# Patient Record
Sex: Female | Born: 1965 | Race: Black or African American | Hispanic: No | Smoking: Never smoker
Health system: Southern US, Community
[De-identification: ages and names within clinical notes are randomized; demographics above are authoritative.]

## PROBLEM LIST (undated history)

## (undated) DIAGNOSIS — K439 Ventral hernia without obstruction or gangrene: Secondary | ICD-10-CM

## (undated) DIAGNOSIS — N83209 Unspecified ovarian cyst, unspecified side: Secondary | ICD-10-CM

## (undated) DIAGNOSIS — S76309A Unspecified injury of muscle, fascia and tendon of the posterior muscle group at thigh level, unspecified thigh, initial encounter: Secondary | ICD-10-CM

## (undated) DIAGNOSIS — N926 Irregular menstruation, unspecified: Secondary | ICD-10-CM

## (undated) DIAGNOSIS — O039 Complete or unspecified spontaneous abortion without complication: Secondary | ICD-10-CM

## (undated) DIAGNOSIS — D219 Benign neoplasm of connective and other soft tissue, unspecified: Secondary | ICD-10-CM

## (undated) DIAGNOSIS — R7611 Nonspecific reaction to tuberculin skin test without active tuberculosis: Secondary | ICD-10-CM

## (undated) DIAGNOSIS — S76319A Strain of muscle, fascia and tendon of the posterior muscle group at thigh level, unspecified thigh, initial encounter: Secondary | ICD-10-CM

## (undated) DIAGNOSIS — K922 Gastrointestinal hemorrhage, unspecified: Secondary | ICD-10-CM

## (undated) HISTORY — DX: Strain of muscle, fascia and tendon of the posterior muscle group at thigh level, unspecified thigh, initial encounter: S76.319A

## (undated) HISTORY — DX: Nonspecific reaction to tuberculin skin test without active tuberculosis: R76.11

## (undated) HISTORY — DX: Ventral hernia without obstruction or gangrene: K43.9

## (undated) HISTORY — DX: Irregular menstruation, unspecified: N92.6

## (undated) HISTORY — DX: Complete or unspecified spontaneous abortion without complication: O03.9

## (undated) HISTORY — DX: Benign neoplasm of connective and other soft tissue, unspecified: D21.9

## (undated) HISTORY — DX: Unspecified ovarian cyst, unspecified side: N83.209

## (undated) HISTORY — DX: Gastrointestinal hemorrhage, unspecified: K92.2

## (undated) HISTORY — PX: OTHER SURGICAL HISTORY: SHX169

## (undated) HISTORY — DX: Unspecified injury of muscle, fascia and tendon of the posterior muscle group at thigh level, unspecified thigh, initial encounter: S76.309A

---

## 1990-12-29 DIAGNOSIS — R7611 Nonspecific reaction to tuberculin skin test without active tuberculosis: Secondary | ICD-10-CM

## 1990-12-29 HISTORY — DX: Nonspecific reaction to tuberculin skin test without active tuberculosis: R76.11

## 2009-12-11 LAB — CBC AND DIFFERENTIAL: WBC: 4.3

## 2009-12-11 LAB — TSH: TSH: 1.08

## 2009-12-11 LAB — FOLLICLE STIMULATING HORMONE: FSH: 63.9

## 2009-12-29 HISTORY — PX: COLPOSCOPY: SHX161

## 2010-10-29 HISTORY — PX: ENDOMETRIAL BIOPSY: SHX622

## 2010-11-11 LAB — CBC AND DIFFERENTIAL
HCT: 36 %
Hemoglobin: 12.2 g/dL (ref 12.0–16.0)
WBC: 3.5

## 2011-04-14 ENCOUNTER — Ambulatory Visit (INDEPENDENT_AMBULATORY_CARE_PROVIDER_SITE_OTHER): Payer: No Typology Code available for payment source | Admitting: Family Medicine

## 2011-04-14 ENCOUNTER — Ambulatory Visit (INDEPENDENT_AMBULATORY_CARE_PROVIDER_SITE_OTHER)
Admission: RE | Admit: 2011-04-14 | Discharge: 2011-04-14 | Disposition: A | Discharge status: Home / Self Care | Payer: No Typology Code available for payment source | Source: Ambulatory Visit | Attending: Family Medicine | Admitting: Family Medicine

## 2011-04-14 ENCOUNTER — Encounter: Payer: Self-pay | Admitting: Family Medicine

## 2011-04-14 VITALS — BP 110/76 | HR 84 | Temp 98.3°F | Ht 62.0 in | Wt 116.0 lb

## 2011-04-14 DIAGNOSIS — S93401A Sprain of unspecified ligament of right ankle, initial encounter: Secondary | ICD-10-CM

## 2011-04-14 DIAGNOSIS — M25579 Pain in unspecified ankle and joints of unspecified foot: Secondary | ICD-10-CM

## 2011-04-14 DIAGNOSIS — N643 Galactorrhea not associated with childbirth: Secondary | ICD-10-CM | POA: Insufficient documentation

## 2011-04-14 DIAGNOSIS — E039 Hypothyroidism, unspecified: Secondary | ICD-10-CM

## 2011-04-14 DIAGNOSIS — M25571 Pain in right ankle and joints of right foot: Secondary | ICD-10-CM | POA: Insufficient documentation

## 2011-04-14 DIAGNOSIS — Z0000 Encounter for general adult medical examination without abnormal findings: Secondary | ICD-10-CM | POA: Insufficient documentation

## 2011-04-14 DIAGNOSIS — S93409A Sprain of unspecified ligament of unspecified ankle, initial encounter: Secondary | ICD-10-CM

## 2011-04-14 LAB — CBC WITH DIFFERENTIAL/PLATELET
Basophils Relative: 0.7 % (ref 0.0–3.0)
Eosinophils Relative: 3 % (ref 0.0–5.0)
HCT: 40.2 % (ref 36.0–46.0)
Lymphs Abs: 1.4 10*3/uL (ref 0.7–4.0)
MCV: 95.8 fl (ref 78.0–100.0)
Monocytes Absolute: 0.3 10*3/uL (ref 0.1–1.0)
Neutrophils Relative %: 41.2 % — ABNORMAL LOW (ref 43.0–77.0)
RBC: 4.2 Mil/uL (ref 3.87–5.11)
WBC: 3.1 10*3/uL — ABNORMAL LOW (ref 4.5–10.5)

## 2011-04-14 LAB — LIPID PANEL
Cholesterol: 188 mg/dL (ref 0–200)
LDL Cholesterol: 112 mg/dL — ABNORMAL HIGH (ref 0–99)
Triglycerides: 52 mg/dL (ref 0.0–149.0)
VLDL: 10.4 mg/dL (ref 0.0–40.0)

## 2011-04-14 LAB — COMPREHENSIVE METABOLIC PANEL
AST: 19 U/L (ref 0–37)
Alkaline Phosphatase: 27 U/L — ABNORMAL LOW (ref 39–117)
BUN: 12 mg/dL (ref 6–23)
Creatinine, Ser: 0.8 mg/dL (ref 0.4–1.2)
Total Bilirubin: 0.4 mg/dL (ref 0.3–1.2)

## 2011-04-14 NOTE — Assessment & Plan Note (Signed)
Consistent with lateral ankle sprain.  Given continued pain, checked xray - negative for fracture. Continue ACE wrap and provided with ankle sprain handout from [facility] patient advisor, provided with stretching exercises and strengthening exercises (ie alphabet in air).

## 2011-04-14 NOTE — Assessment & Plan Note (Signed)
New patient, routine blood work today. Discussed backing down on caffeine, healthy eating. F/u as needed or in 1 year. Provided with order for mammogram, states will obtain at [facility] (where previous records are). No previous PCP so no records to review (besides OBGYN).

## 2011-04-14 NOTE — Assessment & Plan Note (Signed)
Chronic per patient, states has had full w/u including CT, mammo, [place] by OBGYN. Stable, monitor for now.

## 2011-04-14 NOTE — Patient Instructions (Signed)
Xray of ankle today. Blood work today to check cholesterol levels, sugar, thyroid and CBC. Return as needed or in 1 year for next physical. If ankle not improving as expected, return sooner. Good to meet you today, call us with questions.
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
## 2011-04-15 ENCOUNTER — Encounter: Payer: Self-pay | Admitting: Family Medicine

## 2011-04-15 LAB — T3, FREE: T3, Free: 3 pg/mL (ref 2.3–4.2)

## 2011-04-16 ENCOUNTER — Telehealth: Payer: Self-pay | Admitting: Family Medicine

## 2011-04-16 NOTE — Telephone Encounter (Signed)
Patient notified and will call back to schedule labs. She doesn't have er schedule that far in advance. Copy of results mailed to patient as requested.

## 2011-04-16 NOTE — Telephone Encounter (Signed)
Please notify LDL cholesterol 112, other levels fine. CMP overall normal. White count a bit low, with ANC 1.3, would like her to return for recheck in 3 months (CBC, neutropenia).  I'm also requesting records from OBGYN to compare values. TSH a bit low at 0.33 (normal range 0.35-5.5) but free T3 and T4 normal.  Monitor for now, likely recheck next year. Offer to send copy of blood work.

## 2011-05-22 ENCOUNTER — Other Ambulatory Visit: Payer: No Typology Code available for payment source

## 2011-05-27 ENCOUNTER — Encounter: Payer: Self-pay | Admitting: *Deleted

## 2011-05-27 ENCOUNTER — Other Ambulatory Visit: Payer: Self-pay | Admitting: Family Medicine

## 2011-05-27 DIAGNOSIS — Z13220 Encounter for screening for lipoid disorders: Secondary | ICD-10-CM

## 2011-05-28 ENCOUNTER — Other Ambulatory Visit: Payer: No Typology Code available for payment source

## 2011-05-30 ENCOUNTER — Encounter: Payer: Self-pay | Admitting: Family Medicine

## 2011-06-01 ENCOUNTER — Encounter: Payer: Self-pay | Admitting: Family Medicine

## 2011-06-02 ENCOUNTER — Encounter: Payer: Self-pay | Admitting: Family Medicine

## 2012-01-30 DIAGNOSIS — S76309A Unspecified injury of muscle, fascia and tendon of the posterior muscle group at thigh level, unspecified thigh, initial encounter: Secondary | ICD-10-CM

## 2012-01-30 HISTORY — DX: Unspecified injury of muscle, fascia and tendon of the posterior muscle group at thigh level, unspecified thigh, initial encounter: S76.309A

## 2012-02-20 ENCOUNTER — Ambulatory Visit (INDEPENDENT_AMBULATORY_CARE_PROVIDER_SITE_OTHER): Payer: BC Managed Care – PPO | Admitting: Family Medicine

## 2012-02-20 ENCOUNTER — Ambulatory Visit: Payer: No Typology Code available for payment source | Admitting: Family Medicine

## 2012-02-20 ENCOUNTER — Encounter: Payer: Self-pay | Admitting: Family Medicine

## 2012-02-20 VITALS — BP 120/84 | HR 84 | Temp 97.6°F | Ht 62.0 in | Wt 128.0 lb

## 2012-02-20 DIAGNOSIS — S79919A Unspecified injury of unspecified hip, initial encounter: Secondary | ICD-10-CM

## 2012-02-20 DIAGNOSIS — S76309A Unspecified injury of muscle, fascia and tendon of the posterior muscle group at thigh level, unspecified thigh, initial encounter: Secondary | ICD-10-CM | POA: Insufficient documentation

## 2012-02-20 DIAGNOSIS — K439 Ventral hernia without obstruction or gangrene: Secondary | ICD-10-CM | POA: Insufficient documentation

## 2012-02-20 MED ORDER — NAPROXEN 500 MG PO TABS: ORAL_TABLET | ORAL | Status: DC

## 2012-02-20 NOTE — Progress Notes (Addendum)
Subjective:    Patient ID: Lori Parks, female    DOB: 1966/11/20, 46 y.o.   MRN: [id number]  HPI CC: thigh injury  Dr. Cathey Endow (EDP at [facility]) presents today with hamstring injury.  3 wks ago started racing with children - sprinting really fast.  Did not stretch.  Barefoot on asphalt.  Slipped and then felt huge pop posterior hamstring on left.  Then felt like muscles torn on posterior of thigh.  Ecchymosis 3 d after injury that has slowly resolved.  Now 2d h/o numbness at left buttock and 1d h/o burning pain anterior thigh superior to knee as well as burning pain medial and lateral thigh.  Initially felt "muscle hanging out of thigh"  Has been using crutches, motrin, tylenol, wrapping with ace bandages.  No h/o thigh injury in past.  Was runner in past.  No back pain.    If needs ortho referral would like [place] [facility] as closer to home/work.  Also would like to let me know of abd hernia present.  Medications and allergies reviewed and updated in chart.  Past histories reviewed and updated if relevant as below. Patient Active Problem List  Diagnoses  . Routine general medical examination at a health care facility  . Right ankle pain  . Galactorrhea   Past Medical History  Diagnosis Date  . Irregular menses   . Galactorrhea     s/p full w/u including CAT by OBGYN, since ag 16yo  . Ovarian cyst   . Fibroids     mult small myomata (Dr. Dairl Ponder, OBGYN)  . Miscarriage 1990s  . PPD positive, treated 1992    s/p INH treatment   Past Surgical History  Procedure Date  . Colposcopy 2011    WNL, abnl pap CIN1 11/2009, LGSIL neg HPV 10/2010, rec rpt 6 mo  . Endometrial biopsy 10/2010    benign proliferative biopsy, not menopausal   History  Substance Use Topics  . Smoking status: Never Smoker   . Smokeless tobacco: Never Used  . Alcohol Use: No   Family History  Problem Relation Age of Onset  . Arthritis Mother   . Hypertension Mother   . Stroke Maternal Grandmother   .  Hypertension Maternal Grandmother   . Coronary artery disease Neg Hx   . Cancer Neg Hx    Allergies  Allergen Reactions  . Codeine Nausea And Vomiting and Rash   Current Outpatient Prescriptions on File Prior to Visit  Medication Sig Dispense Refill  . medroxyPROGESTERone (PROVERA) 10 MG tablet Take 10 mg by mouth as directed.         Review of Systems per HPI    Objective:   Physical Exam  Nursing note and vitals reviewed. Constitutional: She appears well-developed and well-nourished. No distress.       Walks with crutches, antalgic gait and limp present When sits on exam table favors right buttock  Abdominal:         Hernia present, reducible  Musculoskeletal:       Right upper leg: Normal.       Left upper leg: She exhibits tenderness and swelling. She exhibits no bony tenderness.       Legs:      Ecchymosis left posterior thigh as well as extending into left popliteal area. Very tender at distal and mid hamstring, some tenderness at insertion of hamstring. Prone - unable to assess hamstring strength 2/2 pain/cramping. Supine - FROM of hip flexors and knee flexion.  No pain with  internal/external rotation of   Neurological:       + left buttock numbness present  Skin: Skin is warm and dry.  Psychiatric: She has a normal mood and affect.       Assessment & Plan:

## 2012-02-20 NOTE — Assessment & Plan Note (Signed)
Anticipate diastasis recti vs mild epigastric hernia.  Monitor for now.  Pt states may desire surg eval in future.

## 2012-02-20 NOTE — Assessment & Plan Note (Signed)
Given mechanism of injury, description of initial hematoma, and exam today, concern for hamstring tendon rupture. Will order MRI of left lower extremity.  Pt can get done at [facility]. Depending on results will refer to ortho. May very well have post-injury sciatica, treat with naprosyn. Pt agrees with plan.

## 2012-02-20 NOTE — Patient Instructions (Addendum)
We will send you out with order for MRI of left lower extremity. Sounds like you have developed post-injury sciatica. Treat with naprosyn twice daily with food for a week then as needed. I also will likely refer you to ortho for further eval depending on MRI results. Good to see you today, call us with questions.

## 2012-02-24 NOTE — Progress Notes (Signed)
Addended by: Eustaquio Boyden on: 02/24/2012 10:46 AM   Modules accepted: Orders

## 2012-02-28 ENCOUNTER — Ambulatory Visit: Payer: Self-pay | Admitting: Family Medicine

## 2012-03-01 ENCOUNTER — Encounter: Payer: Self-pay | Admitting: Family Medicine

## 2012-03-01 ENCOUNTER — Telehealth: Payer: Self-pay | Admitting: Family Medicine

## 2012-03-01 DIAGNOSIS — S76309A Unspecified injury of muscle, fascia and tendon of the posterior muscle group at thigh level, unspecified thigh, initial encounter: Secondary | ICD-10-CM

## 2012-03-01 NOTE — Telephone Encounter (Addendum)
Called to discuss results - proximal hamstring avulsion/tear with possible hematoma formation. Left message on answering machine. Will refer to [place] [facility] per pt preference.  placed order in chart, will route to [facility].

## 2012-03-02 ENCOUNTER — Telehealth: Payer: Self-pay | Admitting: Family Medicine

## 2012-03-02 NOTE — Telephone Encounter (Signed)
Called Dr Cathey Endow to help make Ortho consult and she said she already made her own appt with Dr Penni Bombard with [facility] and he has her going to PT , she also said she had the MRI report faxed to Dr Penni Bombard as well.

## 2012-03-02 NOTE — Telephone Encounter (Signed)
Pt states has already scheduled ortho appt.  See next phone note by Shirlee Limerick.  Cancelled order.

## 2012-03-02 NOTE — Telephone Encounter (Signed)
Noted. Thanks.

## 2012-03-09 ENCOUNTER — Telehealth: Payer: Self-pay | Admitting: Family Medicine

## 2012-03-09 NOTE — Telephone Encounter (Signed)
Noted. Thanks.

## 2012-03-09 NOTE — Telephone Encounter (Signed)
Patient called to let you know she's having surgery done on her hamstring tomorrow by Dr.Kaplan.

## 2012-03-18 NOTE — Telephone Encounter (Signed)
Pt called office today to speak with me. Saw Dr. Arlyce Dice - orthopedist in [place] - had hamstring re attachment surgery last week - then post op slipped and had another hamstring tear. Rpt surgery for avulsion a few days later.  Released from Dr. Marzetta Board care today with brace, flying home today. Starting to have burning/tingling in thigh again, worried about rpt avulsion but denies any injury. Kim, can we call her to verify appt for tomorrow - and can we place her on schedule for tomorrow afternoon at 2:15, 30 min appt for f/u hamstring injury?

## 2012-03-18 NOTE — Telephone Encounter (Signed)
Appt scheduled and comfirmed with patient.

## 2012-03-19 ENCOUNTER — Ambulatory Visit: Payer: BC Managed Care – PPO | Admitting: Family Medicine

## 2012-03-19 ENCOUNTER — Telehealth: Payer: Self-pay | Admitting: Family Medicine

## 2012-03-19 MED ORDER — GABAPENTIN 300 MG PO CAPS: ORAL_CAPSULE | ORAL | Status: DC

## 2012-03-19 NOTE — Telephone Encounter (Signed)
Spoke with Dr. Cathey Endow as she was unable to make appt this afternoon.   Flew back yesterday from [place], planning on returning late next week for recheck with surgeon there (Dr. Arlyce Dice). On lovenox after surgery. Endorsing pain, swelling after surgery.  Endorsing some neuropathic pain as well.  Will do trial gabapentin for this. 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 Did touch base with Dr. Salena Saner re pt. Will see if we can schedule appt with Dr. Darrick Penna for ultrasound next week. Advised if worsening over weekend to be evaluated urgently.

## 2012-03-22 ENCOUNTER — Telehealth: Payer: Self-pay | Admitting: Family Medicine

## 2012-03-22 ENCOUNTER — Ambulatory Visit: Payer: BC Managed Care – PPO | Admitting: Family Medicine

## 2012-03-22 DIAGNOSIS — S76309A Unspecified injury of muscle, fascia and tendon of the posterior muscle group at thigh level, unspecified thigh, initial encounter: Secondary | ICD-10-CM

## 2012-03-22 NOTE — Telephone Encounter (Signed)
Did speak with pt this morning - pt scheduled for [facility] appt tomorrow afternoon.

## 2012-03-22 NOTE — Telephone Encounter (Signed)
Touched base with Dr. Darrick Penna and Dr Patsy Lager.  Will set pt up tomorrow for eval at [facility]. Will route note to [facility] asking to set pt up for appt. Tried to reach patient - inbox full and unable to leave message.

## 2012-03-23 ENCOUNTER — Ambulatory Visit (INDEPENDENT_AMBULATORY_CARE_PROVIDER_SITE_OTHER): Payer: BC Managed Care – PPO | Admitting: Family Medicine

## 2012-03-23 VITALS — BP 108/68 | Ht 62.0 in | Wt 122.0 lb

## 2012-03-23 DIAGNOSIS — IMO0002 Reserved for concepts with insufficient information to code with codable children: Secondary | ICD-10-CM

## 2012-03-23 DIAGNOSIS — S76319A Strain of muscle, fascia and tendon of the posterior muscle group at thigh level, unspecified thigh, initial encounter: Secondary | ICD-10-CM

## 2012-03-24 ENCOUNTER — Encounter: Payer: Self-pay | Admitting: Family Medicine

## 2012-03-28 ENCOUNTER — Encounter: Payer: Self-pay | Admitting: Family Medicine

## 2012-03-28 DIAGNOSIS — S76319A Strain of muscle, fascia and tendon of the posterior muscle group at thigh level, unspecified thigh, initial encounter: Secondary | ICD-10-CM | POA: Insufficient documentation

## 2012-03-28 HISTORY — DX: Strain of muscle, fascia and tendon of the posterior muscle group at thigh level, unspecified thigh, initial encounter: S76.319A

## 2012-03-28 NOTE — Progress Notes (Signed)
[facility]:  Patient Name: Lori Parks Date of Birth: 1966/07/10 Medical Record Number: [id number] Gender: female Date of Encounter: 03/23/2012  Dear Dr. Sharen Hones,  Thank you for having me see Lori Parks in consultation today at the [facility] for her problem with Left posterior hamstring pain.  As you may recall, she is a 46 y.o. year old female with a history of Left sided 3 tendinous hamstring complete rupture that originally occurred on February 09, 2012. She had a large hematoma and significant bruising throughout the posterior aspect of her leg. As you recall, you evaluated her on February 20, 2012,  And at that point in addition to the hamstring injury, she had developed some LEFT buttocks numbness as well as some radiculopathy. She was NWB on crutches at that point but had persisted working.  In the following week, the patient saw Dr. Rodolph Bong from [facility], and she had an MRI of her left femur on 02/28/2012. Prior to her MRI, she had started some physical therapy at [facility] in [place]. Her MRI report is reviewed, which is significant for a full-thickness hamstring  insertional rupture. I do not have the original images for my review. Dr. Cathey Endow discussed and reviewed the images with 2 different radiologists, and per her report to me, she had a 6 cm retraction from the ischial tuberosity with a rupture of all 3 hamstring insertions. At this point she discussed the case [facility], and also personally consulted with other orthopedist that she knew from [facility], [place], [place] and other institutions. I agree with her summation that few people in the country had experience with doing a large volume of these repairs.  Ultimately the patient saw Dr. Arlyce Dice from the [facility] of orthopedics. On March 10, 2012, the patient had a LEFT open hand string repair. Operative  reports are reviewed. Her sciatic nerve was found  To be adjacent to the hamstrings with some inflammatory tissue surrounding, and the hamstring tendon was released from the sciatic nerve. The patient had been doing well up to that point.  In the postoperative period, the patient describes an event on March 13, 2012, where she reruptured her repair and hamstring tendon per her report attempting to get to the bathroom - and on record review by standing on a stool and falling and forcefully extending her leg. This was confirmed on a March 16, 2012 MRI of the LEFT hip with a complete full thickness tear of the semimembranosus, semitendinosus, and biceps femoris tendons with a 1.3 cm protraction. At that point, there was no evidence of sciatic nerve transection based on the MRI report. Original images are not available for review.  On March 17, 2012, the patient had a LEFT proximal hamstring avulsion revision, and neurolysis and separation of the HS from sciatic nerve. Per the discharge report from the [facility], the patient was to follow up with Dr. Arlyce Dice in a week, but she flew back to [facility] on 03/18/2012.   Now she is having some posterior thigh pain, question of whether or not she is reruptured her repair again. She is also having some radicular symptoms, and she is having some difficulty everting her foot on the LEFT side. Subjectively, she does have some occasional decreased sensation in her distal lower extremity. She remains nonweight bearing and in a brace provided by the [place] [facility] [place].  Past Medical History  Diagnosis Date  .  Irregular menses   . Galactorrhea     s/p full w/u including CAT by OBGYN, since ag 16yo  . Ovarian cyst   . Fibroids     mult small myomata (Dr. Dairl Ponder, OBGYN)  . Miscarriage 1990s  . 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 Colposcopy 2011    WNL, abnl pap CIN1 11/2009, LGSIL neg HPV 10/2010, rec rpt 6 mo  . Endometrial biopsy 10/2010    benign proliferative biopsy, not menopausal  . Left proximal hamstring avulsion repair w/ sciatic neurolysis x2 (02/2012)    with recurrent avulsion (Dr. Arlyce Dice)    History   Social History  . Marital Status: Divorced    Spouse Name: N/A    Number of Children: 3  . Years of Education: MD   Occupational History  . ER physician     [employer]   Social History Main Topics  . Smoking status: Never Smoker   . Smokeless tobacco: Never Used  . Alcohol Use: No  . Drug Use: No  . Sexually Active: Not on file   Other Topics Concern  . Not on file   Social History Narrative   Caffeine: 4-5 cups coffee.  1 can diet soda/dayLives with mother, 2 children, international student from [place], and cousin.  2 dogs.[employer] ER physician    Family History  Problem Relation Age of Onset  . Arthritis Mother   . Hypertension Mother   . Stroke Maternal Grandmother   . Hypertension Maternal Grandmother   . Coronary artery disease Neg Hx   . Cancer Neg Hx      Review of Systems:  GEN: No fevers, chills. Nontoxic. Primarily MSK c/o today. MSK: Detailed in the HPI GI: tolerating PO intake without difficulty Neuro: detailed above Some pain. Some radicular symptoms. No chest pain. No shortness of breath. Otherwise, the pertinent positives and negatives are listed above and in the HPI, otherwise a full review of systems has been reviewed and is negative unless noted positive.    Physical Examination: Filed Vitals:   03/23/12 1540  BP: 108/68  Height: 5\' 2"  (1.575 m)  Weight: 122 lb (55.339 kg)     General: Alert and oriented x4, in no acute distress. Well-developed well-nourished. Head and neck are grossly normal. Pulmonary: Breathing comparably and in no acute distress. Psychiatric: Mildly anxious, but full  affect, normally interactive and in no acute distress. Not depressed appearing. Neurological: The gross sensation in the LEFT lower extremity appears intact on examination. Eversion maybe slightly decreased compared to the RIGHT.  Bone and joint: The patient is wearing a large hinged brace. She is in a wheelchair. Moves easily with transfer. Posteriorly  On the LEFT the patient does have an incision in the gluteal cleft. Incision is clear dry and intact. There is no obvious defect palpable. Motor function is not assessed extensively due to her immediate postoperative period.  Diagnostic Ultrasound Evalution: [employer] E, MSK ultrasound, MSK probe Anatomy scanned: left hamstring and insertion to ischial tuberosity Indication: Pain Findings: Distal fibers of all 3 in string muscle bellies appear normal in echotexture. There is one area of this congruence appearing muscle fiber in the slightly proximal aspect of the medial hamstring which would appear to correspond to an area of repair. There  is no anechoic or hypoechoic appearance to this area. The hamstring tendon appears taught and contiguous to the area of attachment at the ischial tuberosity. There is evidence of some small degree of echotexture change at the ischial tuberosity which likely reflects  Postoperative change and reattachment to bone. There is a small area adjacent to the attachment which would correspond clinically to some small pseudobursitis. The suture line is visualized, with some change in echo texture which would correspond to her operative repair. There is one area of  Hypoechogenicity it would correspond with resolving hematoma and is not anechoic.  Overall, postoperative changes are visualized, but hamstring muscle belly, tendon, and insertion appear intact.  Assessment and Plan:  Impression: complex 3 tendinous insertional avulsion, operative open hamstring repair complicated by rerupture in the immediate  postoperative period and subsequent open hamstring repair and neurolysis of sciatic nerve.  By ultrasound, it appears that this patient has not reruptured her hamstring again.  Recommendations:  I spent greater than 60 minutes face-to-face with this patient, more than 50% in counseling. Literature reviewed regarding hamstring injury, hamstring proximal full-thickness ruptures, hamstring repair, hamstring syndrome. Dr. Darrick Penna and I also discussed the case, and he performed u/s, as well.  She continues to have pain and some neurological symptoms. For now, I suggested that she get over the immediate postoperative period, but we discussed that she may have persistent nerve dysfunction long-term.  Ideally, I strongly encouraged her to follow up with her orthopedic surgeon Dr. Arlyce Dice for his expertise and guidance.  We will see the patient back in 1 month to follow healing with U/S visualization.  Thank you for having [place] see Lori Kanouse in consultation.  Feel free to contact me with any questions.  Ilias Stcharles T. Zaniah Titterington, MD, CAQ Sports Medicine [facility] [address] [place], [place] [address] Phone: [phone] Fax: [phone]

## 2012-04-20 ENCOUNTER — Ambulatory Visit: Payer: BC Managed Care – PPO | Admitting: Family Medicine

## 2012-05-03 ENCOUNTER — Telehealth: Payer: Self-pay

## 2012-05-03 NOTE — Telephone Encounter (Signed)
Called and spoke with pt. Decreased strength on left side, but now noticing upper extremity weakness as well. Out of brace, started PT, now told ortho doctor wants to see. Seeing ortho Dr. Tonye Becket in [place] [facility]. Calling to get rec on neurologist.  Discussed [facility] vs Dr. Modesto Charon. Pt will call both to check on insurance etc.

## 2012-05-03 NOTE — Telephone Encounter (Signed)
Pt wants to speak with Dr Sharen Hones about hamstring injury. Pt would not go into more detail. 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 [phone].

## 2012-05-06 ENCOUNTER — Emergency Department (HOSPITAL_COMMUNITY)
Admission: EM | Admit: 2012-05-06 | Discharge: 2012-05-07 | Disposition: A | Discharge status: Home / Self Care | Payer: BC Managed Care – PPO | Attending: Emergency Medicine | Admitting: Emergency Medicine

## 2012-05-06 ENCOUNTER — Encounter (HOSPITAL_COMMUNITY): Payer: Self-pay | Admitting: Radiology

## 2012-05-06 ENCOUNTER — Emergency Department (HOSPITAL_COMMUNITY): Payer: BC Managed Care – PPO

## 2012-05-06 DIAGNOSIS — R209 Unspecified disturbances of skin sensation: Secondary | ICD-10-CM | POA: Insufficient documentation

## 2012-05-06 DIAGNOSIS — R29898 Other symptoms and signs involving the musculoskeletal system: Secondary | ICD-10-CM | POA: Insufficient documentation

## 2012-05-06 DIAGNOSIS — R471 Dysarthria and anarthria: Secondary | ICD-10-CM | POA: Insufficient documentation

## 2012-05-06 DIAGNOSIS — Z79899 Other long term (current) drug therapy: Secondary | ICD-10-CM | POA: Insufficient documentation

## 2012-05-06 DIAGNOSIS — R202 Paresthesia of skin: Secondary | ICD-10-CM

## 2012-05-06 LAB — POCT I-STAT, CHEM 8
BUN: 18 mg/dL (ref 6–23)
Chloride: 103 mEq/L (ref 96–112)
Creatinine, Ser: 0.9 mg/dL (ref 0.50–1.10)
Glucose, Bld: 93 mg/dL (ref 70–99)
HCT: 38 % (ref 36.0–46.0)
Potassium: 3.3 mEq/L — ABNORMAL LOW (ref 3.5–5.1)

## 2012-05-06 LAB — BASIC METABOLIC PANEL
Chloride: 102 mEq/L (ref 96–112)
GFR calc Af Amer: >90 mL/min (ref >90)
GFR calc non Af Amer: 85 mL/min — ABNORMAL LOW (ref >90)
Potassium: 3.3 mEq/L — ABNORMAL LOW (ref 3.5–5.1)
Sodium: 140 mEq/L (ref 135–145)

## 2012-05-06 LAB — CBC
Hemoglobin: 12.1 g/dL (ref 12.0–15.0)
MCHC: 33.8 g/dL (ref 30.0–36.0)
RDW: 12.8 % (ref 11.5–15.5)
WBC: 4.1 10*3/uL (ref 4.0–10.5)

## 2012-05-06 MED ORDER — SODIUM CHLORIDE 0.9 % IV SOLN: INTRAVENOUS | Status: DC

## 2012-05-06 NOTE — ED Notes (Addendum)
Patient describes the feeling as being pins and needles in her left extremities. CBG WNL. No facial droop noticed, pt is slurring some of her speech.

## 2012-05-06 NOTE — Code Documentation (Signed)
45yo bf brought in via pvt vehicle for stroke symptoms.  Pt with recent Lt thigh surgery for hamstring avulsion. Has been on crutches & developed Lt arm weakness x1wk ago. Was watching TV this evening and developed Lt UE and Lt facial numbness & tingling. NIH 6 with mild dysarthria, mild aphasia, mild sensory loss, and LLE weakness (unable to do due to recent surgery. Code stroke called 2328, pt arrival 2308, EDP exam 2325, stroke team arrival 2332, LSN 2230, pt arrival in CT 2335, Phlebotomist arrival 2330, MRI Brain planned.

## 2012-05-06 NOTE — ED Notes (Signed)
Patient entered [facility] front door with c/o left hand numbness and left face numbness. Onset 30 minutes ago. Left hand grips weaker than right. Patient AAOx4. No facial droop or slurred speech. Patient states that she just recently had surgery to left leg. Uses crutches. Left arm has been tingling for the past couple of days. Consulting civil engineer notified. Patient immediately transferred to PDA04 via wheelchair. Dr. Norlene Campbell and Ian Malkin, RN given report and at patient's bedside.

## 2012-05-06 NOTE — ED Notes (Signed)
Called [facility] to Page CODE STROKE

## 2012-05-07 ENCOUNTER — Emergency Department (HOSPITAL_COMMUNITY): Payer: BC Managed Care – PPO

## 2012-05-07 DIAGNOSIS — R209 Unspecified disturbances of skin sensation: Secondary | ICD-10-CM

## 2012-05-07 DIAGNOSIS — R4789 Other speech disturbances: Secondary | ICD-10-CM

## 2012-05-07 LAB — PROTIME-INR: INR: 1 (ref 0.00–1.49)

## 2012-05-07 LAB — APTT: aPTT: 26 seconds (ref 24–37)

## 2012-05-07 NOTE — ED Provider Notes (Signed)
History     CSN: [id number]  Arrival date & time 05/06/12  2306   First MD Initiated Contact with Patient 05/06/12 2319      Chief Complaint  Patient presents with  . Code Stroke    (Consider location/radiation/quality/duration/timing/severity/associated sxs/prior treatment) HPI 46 year old female presents to emergency department from home with report of acute onset of pins and needle sensation to left arm and left side of face along with difficulties speaking and word finding ability. Patient reports onset approximately 30 minutes to an hour ago. Patient is about 6 weeks out from surgery to her left hip from torn hamstring. Patient is in physical therapy, and over the last week has noted weakness to her left arm. She is in the process of getting that worked up through neurology. Patient reports she was watching TV when she had onset of symptoms. Initially tried to it nor them but became concerned and brought herself to the emergency department. 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 Patient denies any acute difficulties in swallowing vision thought process or focal weakness aside from the ongoing weakness in her left arm for the past week. Patient was called as a code stroke given her dysarthria and difficulties in finding words. Past Medical History  Diagnosis Date  . Irregular menses   . Galactorrhea     s/p full w/u including CAT by OBGYN, since ag 16yo  . Ovarian cyst   . Fibroids     mult small myomata (Dr. Dairl Ponder, OBGYN)  . Miscarriage 1990s  . 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 Complete proximal hamstring tendon rupture 03/28/2012    Past Surgical History  Procedure Date  . Colposcopy 2011    WNL, abnl pap CIN1 11/2009, LGSIL neg HPV 10/2010, rec rpt 6 mo  . Endometrial biopsy 10/2010    benign proliferative biopsy, not menopausal  . Left proximal hamstring avulsion  repair w/ sciatic neurolysis x2 (02/2012)    with recurrent avulsion (Dr. Arlyce Dice)    Family History  Problem Relation Age of Onset  . Arthritis Mother   . Hypertension Mother   . Stroke Maternal Grandmother   . Hypertension Maternal Grandmother   . Coronary artery disease Neg Hx   . Cancer Neg Hx     History  Substance Use Topics  . Smoking status: Never Smoker   . Smokeless tobacco: Never Used  . Alcohol Use: No    OB History    Grav Para Term Preterm Abortions TAB SAB Ect Mult Living                  Review of Systems  All other systems reviewed and are negative.    Allergies  Codeine  Home Medications   Current Outpatient Rx  Name Route Sig Dispense Refill  . GABAPENTIN 300 MG PO CAPS  Take one nightly for 3 days then may increase to one po bid. 60 capsule 1  . MEDROXYPROGESTERONE ACETATE 10 MG PO TABS Oral Take 10 mg by mouth as directed.      Marland Kitchen NAPROXEN 500 MG PO TABS  Take one po bid x 1 week then prn pain, take with food 60 tablet 0    BP 130/89  Pulse 95  Temp(Src) 98 F (36.7 C) (Oral)  Resp 20  SpO2 98%  LMP 01/13/2011  Physical Exam  Nursing note and vitals  reviewed. Constitutional: She is oriented to person, place, and time. She appears well-developed and well-nourished.       Patient appears anxious and distressed, vitals normal  HENT:  Head: Normocephalic and atraumatic.  Right Ear: External ear normal.  Left Ear: External ear normal.  Nose: Nose normal.  Mouth/Throat: Oropharynx is clear and moist.  Eyes: Conjunctivae and EOM are normal. Pupils are equal, round, and reactive to light.  Neck: Normal range of motion. Neck supple. No JVD present. No tracheal deviation present. No thyromegaly present.  Cardiovascular: Normal rate, regular rhythm, normal heart sounds and intact distal pulses.  Exam reveals no gallop and no friction rub.   No murmur heard. Pulmonary/Chest: Effort normal and breath sounds normal. No stridor. No respiratory  distress. She has no wheezes. She has no rales. She exhibits no tenderness.  Abdominal: Soft. Bowel sounds are normal. She exhibits no distension and no mass. There is no tenderness. There is no rebound and no guarding.  Musculoskeletal:       Left hip held in slight flexion with flexion of the knee secondary to her hip surgery. Patient has full range of motion at the ankle up going toes and normal sensation. Right arm and leg are normal neurovascularly. Left arm with decreased grip compared to right with strength 4/5 in the left arm throughout.  Lymphadenopathy:    She has no cervical adenopathy.  Neurological: She is alert and oriented to person, place, and time. She displays normal reflexes. No cranial nerve deficit. She exhibits normal muscle tone. Coordination normal.       Patient with dysarthria and word finding difficulties  Skin: Skin is warm and dry. No rash noted. No erythema. No pallor.  Psychiatric: She has a normal mood and affect. Her behavior is normal. Judgment and thought content normal.    ED Course  Procedures (including critical care time)  Labs Reviewed  CBC - Abnormal; Notable for the following:    HCT 35.8 (*)    All other components within normal limits  BASIC METABOLIC PANEL - Abnormal; Notable for the following:    Potassium 3.3 (*)    GFR calc non Af Amer 85 (*)    All other components within normal limits  CARDIAC PANEL(CRET KIN+CKTOT+MB+TROPI) - Abnormal; Notable for the following:    Total CK 189 (*)    All other components within normal limits  POCT I-STAT, CHEM 8 - Abnormal; Notable for the following:    Potassium 3.3 (*)    All other components within normal limits  PROTIME-INR  APTT  GLUCOSE, CAPILLARY   Ct Head Wo Contrast  05/06/2012  *RADIOLOGY REPORT*  Clinical Data: Code stroke.  CT HEAD WITHOUT CONTRAST  Technique:  Contiguous axial images were obtained from the base of the skull through the vertex without contrast.  Comparison: None.   Findings: There is no evidence of acute infarction, mass lesion, or intra- or extra-axial hemorrhage on CT.  The posterior fossa, including the cerebellum, brainstem and fourth ventricle, is within normal limits.  The third and lateral ventricles, and basal ganglia are unremarkable in appearance.  The cerebral hemispheres are symmetric in appearance, with normal gray- white differentiation.  No mass effect or midline shift is seen.  There is no evidence of fracture; visualized osseous structures are unremarkable in appearance.  The visualized portions of the orbits are within normal limits.  The paranasal sinuses and mastoid air cells are well-aerated.  No significant soft tissue abnormalities are seen.  IMPRESSION:  Unremarkable noncontrast CT of the head.  These results were called by telephone on 05/06/2012  at  11:44 p.m. to  Dr. Bethann Berkshire, who verbally acknowledged these results.  Original Report Authenticated By: Tonia Ghent, M.D.     1. Paresthesia       MDM  Patient called as a code stroke given word finding difficulties and dysarthria. Patient was evaluated by Dr. Darden Amber nurse and Dr. Thad Ranger. Patient has had any normal noncontrast CT of head. She is to go for emergent MRI. Discuss with Dr. Thad Ranger, if MRI is negative patient will be followed up outpatient.      1:11 AM Patient has had resolution of speech and language symptoms. MRI reassuring. Dr. Thad Ranger will assist patient in finding neurology appointment. Patient comfortable with discharge home  Olivia Mackie, MD 05/07/12 [phone]

## 2012-05-07 NOTE — Consult Note (Addendum)
Referring Physician: Norlene Campbell    Chief Complaint: Left sided numbness  HPI: Lori Parks is an 46 y.o. female who reports that she was at baseline this evening.  Was watching television and noted numbness in her left thumb.  Numbness then spread throughout all of her fingertips-most prominent in the first three.  Then noted numbness on the left side of her face including the left side of the lips.  Speech changed as well and was having difficulty getting her words out. Patient had surgery in March for a hamstring avulsion.  Has been in a cast until recently and after its removal noted weakness on her left, particularly in the LUE for which she was to see a neurologist.  Has been on Lovenox until the last 3 weeks or so.  LSN: [id number] tPA Given: No: Symptoms resolving  Past Medical History  Diagnosis Date  . Irregular menses   . Galactorrhea     s/p full w/u including CAT by OBGYN, since ag 16yo  . Ovarian cyst   . Fibroids     mult small myomata (Dr. Dairl Ponder, OBGYN)  . Miscarriage 1990s  . 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 Complete proximal hamstring tendon rupture 03/28/2012    Past Surgical History  Procedure Date  . Colposcopy 2011    WNL, abnl pap CIN1 11/2009, LGSIL neg HPV 10/2010, rec rpt 6 mo  . Endometrial biopsy 10/2010    benign proliferative biopsy, not menopausal  . Left proximal hamstring avulsion repair w/ sciatic neurolysis x2 (02/2012)    with recurrent avulsion (Dr. Arlyce Dice)    Family History  Problem Relation Age of Onset  . Arthritis Mother   . Hypertension Mother   . Stroke Maternal Grandmother   . Hypertension Maternal Grandmother   . Coronary artery disease Neg Hx   . Cancer Neg Hx    Social History:  reports that she has never smoked. She has never used smokeless tobacco. She reports that she does not drink alcohol or use illicit drugs.  Allergies:    Allergies  Allergen Reactions  . Codeine Nausea And Vomiting and Rash    Medications: I have reviewed the patient's current medications. Prior to Admission:  Neurontin, Provera, Naprosyn  ROS: History obtained from the patient  General ROS: negative for - chills, fatigue, fever, night sweats, weight gain or weight loss Psychological ROS: negative for - behavioral disorder, hallucinations, memory difficulties, mood swings or suicidal ideation Ophthalmic ROS: negative for - blurry vision, double vision, eye pain or loss of vision ENT ROS: negative for - epistaxis, nasal discharge, oral lesions, sore throat, tinnitus or vertigo Allergy and Immunology ROS: negative for - hives or itchy/watery eyes Hematological and Lymphatic ROS: negative for - bleeding problems, bruising or swollen lymph nodes Endocrine ROS: negative for - galactorrhea, hair pattern changes, polydipsia/polyuria or temperature intolerance Respiratory ROS: negative for - cough, hemoptysis, shortness of breath or wheezing Cardiovascular ROS: negative for - chest pain, dyspnea on exertion, edema or irregular heartbeat Gastrointestinal ROS: negative for - abdominal pain, diarrhea, hematemesis, nausea/vomiting or stool incontinence Genito-Urinary ROS: negative for - dysuria, hematuria, incontinence or urinary frequency/urgency Musculoskeletal ROS: recent left leg surgery Neurological ROS: as noted in HPI Dermatological ROS: negative for rash and skin lesion changes  Physical Examination: Blood pressure 130/89, pulse 95, temperature 98 F (36.7 C), temperature source Oral, resp. rate 20, SpO2 98.00%.  Neurologic Examination: Mental Status: Alert, oriented, thought content appropriate.  Speech fluent without evidence of aphasia.  Able to follow 3 step commands without difficulty. Cranial Nerves: II: visual fields grossly normal, pupils equal, round, reactive to light and accommodation III,IV, VI: ptosis not present,  extra-ocular motions intact bilaterally V,VII: smile symmetric, facial light touch sensation decreased on the left VIII: hearing normal bilaterally IX,X: gag reflex present XI: trapezius strength/neck flexion strength normal bilaterally XII: tongue strength normal  Motor: Right : Upper extremity   5/5    Left:     Upper extremity- give-way weakness with formal testing; no pronator drift  Lower extremity   5/5     Lower extremity - unable to be tested secondary to post surgical restrictions Tone and bulk:normal tone throughout; no atrophy noted Sensory: Light touch decreased on the left upper and lower extremity Deep Tendon Reflexes: 2+ and symmetric throughout.  Left KJ not tested Plantars: Right: downgoing   Left: downgoing Cerebellar: normal finger-to-nose and normal heel-to-shin test, excluding LLE   Results for orders placed during the hospital encounter of 05/06/12 (from the past 48 hour(s))  CBC     Status: Abnormal   Collection Time   05/06/12 11:20 PM      Component Value Range Comment   WBC 4.1  4.0 - 10.5 (K/uL)    RBC 3.94  3.87 - 5.11 (MIL/uL)    Hemoglobin 12.1  12.0 - 15.0 (g/dL)    HCT 16.1 (*) 09.6 - 46.0 (%)    MCV 90.9  78.0 - 100.0 (fL)    MCH 30.7  26.0 - 34.0 (pg)    MCHC 33.8  30.0 - 36.0 (g/dL)    RDW 04.5  40.9 - 81.1 (%)    Platelets 202  150 - 400 (K/uL)   BASIC METABOLIC PANEL     Status: Abnormal   Collection Time   05/06/12 11:20 PM      Component Value Range Comment   Sodium 140  135 - 145 (mEq/L)    Potassium 3.3 (*) 3.5 - 5.1 (mEq/L)    Chloride 102  96 - 112 (mEq/L)    CO2 29  19 - 32 (mEq/L)    Glucose, Bld 94  70 - 99 (mg/dL)    BUN 17  6 - 23 (mg/dL)    Creatinine, Ser 9.14  0.50 - 1.10 (mg/dL)    Calcium 9.4  8.4 - 10.5 (mg/dL)    GFR calc non Af Amer 85 (*) >90 (mL/min)    GFR calc Af Amer >90  >90 (mL/min)   PROTIME-INR     Status: Normal   Collection Time   05/06/12 11:20 PM      Component Value Range Comment   Prothrombin Time 13.4   11.6 - 15.2 (seconds)    INR 1.00  0.00 - 1.49    APTT     Status: Normal   Collection Time   05/06/12 11:20 PM      Component Value Range Comment   aPTT 26  24 - 37 (seconds)   CARDIAC PANEL(CRET KIN+CKTOT+MB+TROPI)     Status: Abnormal   Collection Time   05/06/12 11:21 PM      Component Value Range Comment   Total CK 189 (*) 7 - 177 (U/L)    CK, MB 1.8  0.3 - 4.0 (ng/mL)    Troponin I <0.30  <0.30 (ng/mL)    Relative Index 1.0  0.0 - 2.5    GLUCOSE, CAPILLARY  Status: Normal   Collection Time   05/06/12 11:49 PM      Component Value Range Comment   Glucose-Capillary 85  70 - 99 (mg/dL)   POCT I-STAT, CHEM 8     Status: Abnormal   Collection Time   05/06/12 11:51 PM      Component Value Range Comment   Sodium 144  135 - 145 (mEq/L)    Potassium 3.3 (*) 3.5 - 5.1 (mEq/L)    Chloride 103  96 - 112 (mEq/L)    BUN 18  6 - 23 (mg/dL)    Creatinine, Ser 1.19  0.50 - 1.10 (mg/dL)    Glucose, Bld 93  70 - 99 (mg/dL)    Calcium, Ion 1.47  1.12 - 1.32 (mmol/L)    TCO2 29  0 - 100 (mmol/L)    Hemoglobin 12.9  12.0 - 15.0 (g/dL)    HCT 82.9  56.2 - 13.0 (%)    Ct Head Wo Contrast  05/06/2012  *RADIOLOGY REPORT*  Clinical Data: Code stroke.  CT HEAD WITHOUT CONTRAST  Technique:  Contiguous axial images were obtained from the base of the skull through the vertex without contrast.  Comparison: None.  Findings: There is no evidence of acute infarction, mass lesion, or intra- or extra-axial hemorrhage on CT.  The posterior fossa, including the cerebellum, brainstem and fourth ventricle, is within normal limits.  The third and lateral ventricles, and basal ganglia are unremarkable in appearance.  The cerebral hemispheres are symmetric in appearance, with normal gray- white differentiation.  No mass effect or midline shift is seen.  There is no evidence of fracture; visualized osseous structures are unremarkable in appearance.  The visualized portions of the orbits are within normal limits.  The  paranasal sinuses and mastoid air cells are well-aerated.  No significant soft tissue abnormalities are seen.  IMPRESSION: Unremarkable noncontrast CT of the head.  These results were called by telephone on 05/06/2012  at  11:44 p.m. to  Dr. Bethann Berkshire, who verbally acknowledged these results.  Original Report Authenticated By: Tonia Ghent, M.D.    Assessment: 46 y.o. female presenting with complaints of left sided numbness, difficulty with speech.  Initial imaging unremarkable. Speech cleared while in the ED.  Continues to complain of numbness although improved as well.  At this point think that ischemic disease is unlikely but further work up is indicated.     Stroke Risk Factors - none  Plan: 1. MRI of the brain without contrast.  If MRI unremarkable would not recommend any further stroke work up at this time.    Addendum:  MRI of the brain performed.  DWI imaging showed no evidence of an acute infacrt.  Patient's major complaints at this time are actually mostly things that have been present for more than a week.  Would have patient follow up with neurology as an outpatient.  Will help facilitate an outpatient appointment for the patient since she has been having some difficulty with this.   Thana Farr, MD [facility] [phone] 05/07/2012, 12:15 AM     Patient has been made a appointment with Dr. Modesto Charon on July 2.  [facility] Maurice [facility] patient and inform her of date and time of appointment.   Felicie Morn PA-C [facility] [phone]  05/07/2012, 1:19 PM

## 2012-05-07 NOTE — Discharge Instructions (Signed)
Please followup with neurology. Dr. Thad Ranger will help in getting you an appointment. Return to the emergency department for worsening condition or new concerning symptoms.  Paresthesia Paresthesia is an abnormal burning or prickling sensation. This sensation is generally felt in the hands, arms, legs, or feet. However, it may occur in any part of the body. It is usually not painful. The feeling may be described as:  Tingling or numbness.   "Pins and needles."   Skin crawling.   Buzzing.   Limbs "falling asleep."   Itching.  Most people experience temporary (transient) paresthesia at some time in their lives. CAUSES  Paresthesia may occur when you breathe too quickly (hyperventilation). It can also occur without any apparent cause. Commonly, paresthesia occurs when pressure is placed on a nerve. The feeling quickly goes away once the pressure is removed. For some people, however, paresthesia is a long-lasting (chronic) condition caused by an underlying disorder. The underlying disorder may be:  A traumatic, direct injury to nerves. Examples include a:   Broken (fractured) neck.   Fractured skull.   A disorder affecting the brain and spinal cord (central nervous system). Examples include:   Transverse myelitis.   Encephalitis.   Transient ischemic attack.   Multiple sclerosis.   Stroke.   Tumor or blood vessel problems, such as an arteriovenous malformation pressing against the brain or spinal cord.   A condition that damages the peripheral nerves (peripheral neuropathy). Peripheral nerves are not part of the brain and spinal cord. These conditions include:   Diabetes.   Peripheral vascular disease.   Nerve entrapment syndromes, such as carpal tunnel syndrome.   Shingles.   Hypothyroidism.   Vitamin B12 deficiencies.   Alcoholism.   Heavy metal poisoning (lead, arsenic).   Rheumatoid arthritis.   Systemic lupus erythematosus.  DIAGNOSIS  Your caregiver  will attempt to find the underlying cause of your paresthesia. Your caregiver may:  Take your medical history.   Perform a physical exam.   Order various lab tests.   Order imaging tests.  TREATMENT  Treatment for paresthesia depends on the underlying cause. HOME CARE INSTRUCTIONS  Avoid drinking alcohol.   You may consider massage or acupuncture to help relieve your symptoms.   Keep all follow-up appointments as directed by your caregiver.  SEEK IMMEDIATE MEDICAL CARE IF:   You feel weak.   You have trouble walking or moving.   You have problems with speech or vision.   You feel confused.   You cannot control your bladder or bowel movements.   You feel numbness after an injury.   You faint.   Your burning or prickling feeling gets worse when walking.   You have pain, cramps, or dizziness.   You develop a rash.  MAKE SURE YOU:  Understand these instructions.   Will watch your condition.   Will get help right away if you are not doing well or get worse.  Document Released: 12/05/2002 Document Revised: 12/04/2011 Document Reviewed: 09/05/2011 [facility] Patient Information 2012 [place], [place].

## 2012-05-09 NOTE — Consult Note (Signed)
Appointment made.  Patient made aware.  Thana Farr, MD [facility] [phone]

## 2012-07-09 ENCOUNTER — Ambulatory Visit: Payer: BC Managed Care – PPO | Admitting: Neurology

## 2012-09-28 ENCOUNTER — Other Ambulatory Visit: Payer: Self-pay | Admitting: Family Medicine

## 2012-11-30 ENCOUNTER — Encounter: Payer: Self-pay | Admitting: Gastroenterology

## 2012-11-30 ENCOUNTER — Encounter: Payer: Self-pay | Admitting: Family Medicine

## 2012-11-30 ENCOUNTER — Ambulatory Visit (INDEPENDENT_AMBULATORY_CARE_PROVIDER_SITE_OTHER): Payer: BC Managed Care – PPO | Admitting: Internal Medicine

## 2012-11-30 ENCOUNTER — Telehealth: Payer: Self-pay

## 2012-11-30 VITALS — BP 100/60 | HR 81 | Temp 98.6°F | Wt 126.0 lb

## 2012-11-30 DIAGNOSIS — K922 Gastrointestinal hemorrhage, unspecified: Secondary | ICD-10-CM

## 2012-11-30 HISTORY — DX: Gastrointestinal hemorrhage, unspecified: K92.2

## 2012-11-30 LAB — HEPATIC FUNCTION PANEL
AST: 20 U/L (ref 0–37)
Albumin: 4 g/dL (ref 3.5–5.2)
Alkaline Phosphatase: 36 U/L — ABNORMAL LOW (ref 39–117)

## 2012-11-30 LAB — CBC WITH DIFFERENTIAL/PLATELET
Basophils Relative: 0.6 % (ref 0.0–3.0)
Eosinophils Absolute: 0.1 10*3/uL (ref 0.0–0.7)
Eosinophils Relative: 2.7 % (ref 0.0–5.0)
Lymphocytes Relative: 41.5 % (ref 12.0–46.0)
Neutrophils Relative %: 46.4 % (ref 43.0–77.0)
RBC: 3.92 Mil/uL (ref 3.87–5.11)
WBC: 3.8 10*3/uL — ABNORMAL LOW (ref 4.5–10.5)

## 2012-11-30 LAB — BASIC METABOLIC PANEL
BUN: 17 mg/dL (ref 6–23)
Chloride: 104 mEq/L (ref 96–112)
Glucose, Bld: 79 mg/dL (ref 70–99)
Potassium: 3.7 mEq/L (ref 3.5–5.1)

## 2012-11-30 LAB — TSH: TSH: 1.15 u[IU]/mL (ref 0.35–5.50)

## 2012-11-30 MED ORDER — OMEPRAZOLE 20 MG PO CPDR: 20.0000 mg | DELAYED_RELEASE_CAPSULE | Freq: Every day | ORAL | Status: DC

## 2012-11-30 MED ORDER — FLUCONAZOLE 150 MG PO TABS: ORAL_TABLET | ORAL | Status: DC

## 2012-11-30 NOTE — Telephone Encounter (Signed)
Noted.  I have no slots at all today.  If another provider has availabilities, would prefer that.  If needed, could possibly work in at around 12:45pm or 1pm??

## 2012-11-30 NOTE — Addendum Note (Signed)
Addended by: Sueanne Margarita on: 11/30/2012 01:03 PM   Modules accepted: Orders

## 2012-11-30 NOTE — Telephone Encounter (Signed)
pt request appt due to burning in chest; epigastric area,nausea, feels tired and lethargic and black stools which are guiac positive for blood.No abdomiinal pain,no fever.Started about 48 hrs ago.Please advise.

## 2012-11-30 NOTE — Patient Instructions (Signed)
Please call [phone] if you have increased black stools Please stop all ibuprofen

## 2012-11-30 NOTE — Progress Notes (Signed)
Subjective:    Patient ID: Lori Parks, female    DOB: October 19, 1966, 46 y.o.   MRN: [id number]  HPI 3 days ago she noticed her stool is dark Thought it might be from multivitamin Had been on a cruise --wondered if it could be from the food Last night, stool was darker  Noticed some burning in chest Started yesterday and has persists All the way to throat Has been using ibuprofen regularly---afraid to take her narcotics Leg was hurting more so used more ibuprofen---probably 3 of 7 days on cruise (800mg  daily) Also used some tylenol and ultram  Some fatigue Drove back from [place] after the cruise [place] and [place] stops but basically ate on the cruise  No pepto bismol and no iron supplements Some dizziness---related it to the cruise Felt weak last night She did a guaic herself and it was positive No antacids  Current Outpatient Prescriptions on File Prior to Visit  Medication Sig Dispense Refill  . gabapentin (NEURONTIN) 300 MG capsule TAKE ONE NIGHTLY FOR 3 DAYS THEN MAY INCREASE TO ONE BY MOUTH TWICE A DAY .  60 capsule  3    Allergies  Allergen Reactions  . Codeine Nausea And Vomiting and Rash    Past Medical History  Diagnosis Date  . Irregular menses   . Galactorrhea     s/p full w/u including CT by OBGYN, since age 46yo  . Ovarian cyst   . Fibroids     mult small myomata (Dr. Dairl Ponder, OBGYN)  . Miscarriage 1990s  . 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 Complete proximal hamstring tendon rupture 03/28/2012    Past Surgical History  Procedure Date  . Colposcopy 2011    WNL, abnl pap CIN1 11/2009, LGSIL neg HPV 10/2010, rec rpt 6 mo  . Endometrial biopsy 10/2010    benign proliferative biopsy, not menopausal  . Left proximal hamstring avulsion repair w/ sciatic neurolysis x2 (02/2012)    with recurrent avulsion (Dr. Arlyce Dice)    Family History  Problem  Relation Age of Onset  . Arthritis Mother   . Hypertension Mother   . Stroke Maternal Grandmother   . Hypertension Maternal Grandmother   . Coronary artery disease Neg Hx   . Cancer Neg Hx     History   Social History  . Marital Status: Divorced    Spouse Name: N/A    Number of Children: 3  . Years of Education: MD   Occupational History  . ER physician     [employer]   Social History Main Topics  . Smoking status: Never Smoker   . Smokeless tobacco: Never Used  . Alcohol Use: No  . Drug Use: No  . Sexually Active: Not on file   Other Topics Concern  . Not on file   Social History Narrative   Caffeine: 4-5 cups coffee.  1 can diet soda/dayLives with mother, 2 children, international student from [place], and cousin.  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 No distress.       Mild orthostatic dizziness but can stay standing  Supine BP 110/70  Pulse 72 Standing BP 102/78  Pulse 80  Neck: Normal range of  motion. Neck supple. No thyromegaly present.  Cardiovascular: Normal rate, regular rhythm and normal heart sounds.  Exam reveals no gallop.   No murmur heard. Pulmonary/Chest: Effort normal and breath sounds normal. No respiratory distress. She has no wheezes. She has no rales.  Abdominal: Soft. Bowel sounds are normal. She exhibits no distension and no mass. There is no tenderness. There is no rebound and no guarding.  Musculoskeletal: She exhibits no edema and no tenderness.  Lymphadenopathy:    She has no cervical adenopathy.  Psychiatric: She has a normal mood and affect. Her behavior is normal.          Assessment & Plan:

## 2012-11-30 NOTE — Telephone Encounter (Signed)
Pt scheduled appt with Dr Alphonsus Sias today at 11:45 am as instructed.

## 2012-11-30 NOTE — Assessment & Plan Note (Signed)
Seems to be UGI from the burning symptoms Her age makes colonoscopy probably appropriate  Stools are not frequent so doubt sig bleeding Not orthostatic Will check labs Stop NSAIDs Omeprazole 20mg  bid To ER if increased stools

## 2012-12-01 ENCOUNTER — Ambulatory Visit: Payer: BC Managed Care – PPO | Admitting: Family Medicine

## 2012-12-02 ENCOUNTER — Encounter: Payer: Self-pay | Admitting: *Deleted

## 2012-12-21 ENCOUNTER — Ambulatory Visit: Payer: BC Managed Care – PPO | Admitting: Gastroenterology

## 2012-12-31 ENCOUNTER — Telehealth: Payer: Self-pay | Admitting: Gastroenterology

## 2012-12-31 NOTE — Telephone Encounter (Signed)
Message copied by Arna Snipe on Fri Dec 31, 2012  8:31 AM ------      Message from: Donata Duff      Created: Tue Dec 21, 2012  8:42 AM       Do not bill

## 2013-01-18 ENCOUNTER — Ambulatory Visit: Payer: BC Managed Care – PPO | Admitting: Internal Medicine

## 2013-03-08 ENCOUNTER — Ambulatory Visit: Payer: BC Managed Care – PPO | Admitting: Internal Medicine

## 2013-03-24 ENCOUNTER — Telehealth: Payer: Self-pay | Admitting: Internal Medicine

## 2013-03-24 NOTE — Telephone Encounter (Signed)
Message copied by Arna Snipe on Thu Mar 24, 2013  2:29 PM ------      Message from: Richardson Chiquito      Created: Wed Mar 09, 2013  8:07 AM                   ----- Message -----         From: Hart Carwin, MD         Sent: 03/08/2013   5:57 PM           To: Richardson Chiquito, CMA            Charge no show fee      ----- Message -----         From: Richardson Chiquito, CMA         Sent: 03/08/2013   5:04 PM           To: Hart Carwin, MD            Patient no showed appointment with Dr Juanda Chance on 03/08/13. Dr Juanda Chance, do you want to charge no show fee?       ------

## 2014-09-27 ENCOUNTER — Ambulatory Visit (INDEPENDENT_AMBULATORY_CARE_PROVIDER_SITE_OTHER): Payer: BC Managed Care – PPO | Admitting: Family Medicine

## 2014-09-27 ENCOUNTER — Encounter: Payer: Self-pay | Admitting: Family Medicine

## 2014-09-27 VITALS — BP 108/60 | HR 90 | Temp 99.1°F | Ht 62.0 in | Wt 128.0 lb

## 2014-09-27 DIAGNOSIS — R509 Fever, unspecified: Secondary | ICD-10-CM

## 2014-09-27 DIAGNOSIS — J029 Acute pharyngitis, unspecified: Secondary | ICD-10-CM

## 2014-09-27 DIAGNOSIS — J020 Streptococcal pharyngitis: Secondary | ICD-10-CM | POA: Insufficient documentation

## 2014-09-27 LAB — POCT RAPID STREP A (OFFICE): RAPID STREP A SCREEN: POSITIVE — AB

## 2014-09-27 LAB — POCT INFLUENZA A/B
INFLUENZA A, POC: NEGATIVE
INFLUENZA B, POC: NEGATIVE

## 2014-09-27 MED ORDER — AMOXICILLIN 500 MG PO CAPS: 500.0000 mg | ORAL_CAPSULE | Freq: Three times a day (TID) | ORAL | Status: DC

## 2014-09-27 NOTE — Progress Notes (Signed)
Pre visit review using our clinic review tool, if applicable. No additional management support is needed unless otherwise documented below in the visit note. 

## 2014-09-27 NOTE — Patient Instructions (Signed)
Take your amoxicillin Drink fluids Tylenol and rest   No work for 24 hours

## 2014-09-27 NOTE — Progress Notes (Signed)
Subjective:    Patient ID: Lori Parks, female    DOB: 1966-11-23, 48 y.o.   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 GI bleed 11/30/2012  . Complete proximal hamstring tendon rupture 03/28/2012  . Ventral hernia 02/20/2012  . Routine general medical examination at a health care facility 04/14/2011  . Right ankle pain 04/14/2011  . Galactorrhea 04/14/2011   Past Medical History  Diagnosis Date  . Irregular menses   . Galactorrhea     s/p full w/u including CT by OBGYN, since age 61yo  . Ovarian cyst   . Fibroids     mult small myomata (Dr. Neysa Bonito, OBGYN)  . Miscarriage 1990s  . 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 Complete proximal hamstring tendon rupture 03/28/2012   Past Surgical History  Procedure Laterality Date  . Colposcopy  2011    WNL, abnl pap CIN1 11/2009, LGSIL neg HPV 10/2010, rec rpt 6 mo  . Endometrial biopsy  10/2010    benign proliferative biopsy, not menopausal  . Left proximal hamstring avulsion repair w/ sciatic neurolysis  x2 (02/2012)    with recurrent avulsion (Dr. Deatra Ina)   History  Substance Use Topics  . Smoking status: Never Smoker   . Smokeless tobacco: Never Used  . Alcohol Use: No   Family History  Problem Relation Age of Onset  . Arthritis Mother   . Hypertension Mother   . Stroke Maternal Grandmother   . Hypertension Maternal Grandmother   . Coronary artery disease Neg Hx   . Cancer Neg Hx    Allergies  Allergen Reactions  . Bactrim [Sulfamethoxazole-Tmp Ds] Swelling  . Codeine Nausea And Vomiting  and Rash   Current Outpatient Prescriptions on File Prior to Visit  Medication Sig Dispense Refill  . fish oil-omega-3 fatty acids 1000 MG capsule Take 1 g by mouth daily.      . Multiple Vitamin (MULTIVITAMIN) tablet Take 1 tablet by mouth daily.      Marland Kitchen omeprazole (PRILOSEC) 20 MG capsule Take 1 capsule (20 mg total) by mouth daily.  60 capsule  3   No current facility-administered medications on file prior to visit.       Results for orders placed in visit on 09/27/14  POCT RAPID STREP A (OFFICE)      Result Value Ref Range   Rapid Strep A Screen Positive (*) Negative  POCT INFLUENZA A/B      Result Value Ref Range   Influenza A, POC Negative     Influenza B, POC Negative        Review of Systems    Review of Systems  Constitutional: Negative for , appetite change,  and unexpected weight change. pos for fever/malaise ENT pos for ST and neg for sinus pain  Eyes: Negative for pain and visual disturbance.  Respiratory: Negative for cough and shortness of breath.   Cardiovascular: Negative for cp or palpitations    Gastrointestinal: Negative for  nausea, diarrhea and constipation.  Genitourinary: Negative for urgency and frequency.  Skin: Negative for pallor or rash   MSK neg for neck stiffness  Neurological: Negative for weakness, light-headedness, numbness and headaches.  Hematological: Negative for adenopathy. Does not bruise/bleed easily.  Psychiatric/Behavioral: Negative for dysphoric mood. The patient is not nervous/anxious.      Objective:   Physical Exam  Constitutional: She appears well-developed and well-nourished. No distress.  HENT:  Head: Normocephalic and atraumatic.  Right Ear: External ear normal.  Left Ear: External ear normal.  Mouth/Throat: Oropharyngeal exudate present.  Boggy nares Throat erythematous with tonsillar swelling and exudate   No sinus tenderness   Eyes: Conjunctivae and EOM are normal. Pupils are equal, round, and reactive to light.  Right eye exhibits no discharge.  Neck: Normal range of motion. Neck supple.  Few anterior cervical LN   Cardiovascular: Regular rhythm, normal heart sounds and intact distal pulses.   Pulmonary/Chest: Effort normal and breath sounds normal. No respiratory distress. She has no wheezes. She has no rales.  Musculoskeletal: She exhibits no edema.  Lymphadenopathy:    She has cervical adenopathy.  Neurological: She is alert. No cranial nerve deficit.  Skin: Skin is warm and dry. No rash noted. No erythema. No pallor.  Psychiatric: She has a normal mood and affect.          Assessment & Plan:

## 2014-09-28 ENCOUNTER — Telehealth: Payer: Self-pay

## 2014-09-28 ENCOUNTER — Encounter: Payer: Self-pay | Admitting: *Deleted

## 2014-09-28 MED ORDER — PREDNISONE 20 MG PO TABS: ORAL_TABLET | ORAL | Status: DC

## 2014-09-28 NOTE — Telephone Encounter (Signed)
Pt was seen 09/27/14 and advised to be out of work 24 hours but was advised to cb if more time off work needed. Pt feels very bad today, still fever and S/T is very painful and feels like pain going down neck. Pt request steroids to [facility]. Pt also request letter to be excused from work on 09/28/14 and 09/29/14. Dr Valetta Close request cb.

## 2014-09-28 NOTE — Telephone Encounter (Signed)
Ok to write letter for out of work rest of week. I recommend she start with ibuprofen 600mg  and if failed this may fill prednisone course - sent to [facility] [address]

## 2014-09-28 NOTE — Assessment & Plan Note (Signed)
tx with amoxicillin Will stay out of work for 24 hours  Disc symptomatic care - see instructions on AVS -fluids/ rest / analgesics   Update if not starting to improve in a week or if worsening

## 2014-09-28 NOTE — Telephone Encounter (Addendum)
Work note printed, and left at the front desk. Left message on voicemail for pt to return call to office.

## 2014-09-29 NOTE — Telephone Encounter (Signed)
I called pt with instructions. She says she was initially advised to take TYL 1000mg  Q4 hours, but it didn't help. She has hx of gi bleed from ibp, but pain hadn't improved so she did try taking ibp taken along with an omeprazole tab, and mylanta out of desperation, but has d/c it. She will start prednisone and call if sx don't improve, or get worse.

## 2014-12-16 ENCOUNTER — Other Ambulatory Visit: Payer: Self-pay | Admitting: Internal Medicine

## 2015-01-29 DIAGNOSIS — K439 Ventral hernia without obstruction or gangrene: Secondary | ICD-10-CM | POA: Insufficient documentation

## 2015-01-29 HISTORY — DX: Ventral hernia without obstruction or gangrene: K43.9

## 2015-02-03 ENCOUNTER — Encounter: Payer: Self-pay | Admitting: Family Medicine

## 2015-02-15 ENCOUNTER — Encounter: Payer: Self-pay | Admitting: Family Medicine

## 2015-02-15 DIAGNOSIS — S83003A Unspecified subluxation of unspecified patella, initial encounter: Secondary | ICD-10-CM | POA: Insufficient documentation

## 2015-03-03 ENCOUNTER — Other Ambulatory Visit: Payer: Self-pay | Admitting: Family Medicine

## 2015-03-05 NOTE — Telephone Encounter (Signed)
Received refill request from pharmacy. Medication is not on medication list. Spoke to [facility] at pharmacy and was advised that the original script came from Dr. Christean Leaf #[id number] on 01/26/15.  Is it okay to refill medication?

## 2015-03-29 ENCOUNTER — Encounter: Payer: Self-pay | Admitting: Family Medicine

## 2015-03-30 HISTORY — PX: COLPOSCOPY: SHX161

## 2015-04-02 ENCOUNTER — Other Ambulatory Visit: Payer: Self-pay | Admitting: Internal Medicine

## 2015-04-29 HISTORY — PX: EPIGASTRIC HERNIA REPAIR: SUR1177

## 2015-05-10 ENCOUNTER — Encounter: Payer: Self-pay | Admitting: Family Medicine

## 2015-08-19 ENCOUNTER — Encounter: Payer: Self-pay | Admitting: Family Medicine

## 2015-12-22 ENCOUNTER — Other Ambulatory Visit: Payer: Self-pay | Admitting: Internal Medicine

## 2017-05-27 ENCOUNTER — Ambulatory Visit: Payer: BC Managed Care – PPO | Admitting: Family Medicine

## 2017-05-27 DIAGNOSIS — Z0289 Encounter for other administrative examinations: Secondary | ICD-10-CM

## 2017-06-17 ENCOUNTER — Ambulatory Visit: Payer: BC Managed Care – PPO | Admitting: Family Medicine

## 2017-06-17 ENCOUNTER — Encounter: Payer: Self-pay | Admitting: Family Medicine

## 2017-06-17 ENCOUNTER — Ambulatory Visit (INDEPENDENT_AMBULATORY_CARE_PROVIDER_SITE_OTHER): Payer: BC Managed Care – PPO | Admitting: Family Medicine

## 2017-06-17 ENCOUNTER — Ambulatory Visit (INDEPENDENT_AMBULATORY_CARE_PROVIDER_SITE_OTHER)
Admission: RE | Admit: 2017-06-17 | Discharge: 2017-06-17 | Disposition: A | Discharge status: Home / Self Care | Payer: BC Managed Care – PPO | Source: Ambulatory Visit | Attending: Family Medicine | Admitting: Family Medicine

## 2017-06-17 VITALS — BP 110/76 | HR 88 | Temp 98.3°F | Ht 62.0 in | Wt 128.5 lb

## 2017-06-17 DIAGNOSIS — R059 Cough, unspecified: Secondary | ICD-10-CM

## 2017-06-17 DIAGNOSIS — R05 Cough: Secondary | ICD-10-CM | POA: Diagnosis not present

## 2017-06-17 LAB — HM PAP SMEAR

## 2017-06-17 MED ORDER — PREDNISONE 20 MG PO TABS: ORAL_TABLET | ORAL | 0 refills | Status: DC

## 2017-06-17 MED ORDER — BENZONATATE 100 MG PO CAPS: 100.0000 mg | ORAL_CAPSULE | Freq: Three times a day (TID) | ORAL | 0 refills | Status: DC | PRN

## 2017-06-17 NOTE — Patient Instructions (Addendum)
I think you have persistent bronchospasm after prior bronchitis. Treat with prednisone taper. May use tessalon perls during the day as well as cough drops.  Let us know if persistent coughing despite above.

## 2017-06-17 NOTE — Assessment & Plan Note (Addendum)
Prolonged cough, benign exam today.  Anticipate persistent bronchospasm after initial bronchitis illness.  Risk factors for TB exposure - check CXR today.  Will treat with prednisone course for inflammation, tessalon perls.  No signs of bacterial infection today. Update if not improving with treatment - consider pulm eval of chronic cough. Pt agrees with plan.

## 2017-06-17 NOTE — Progress Notes (Signed)
BP 110/76   Pulse 88   Temp 98.3 F (36.8 C)   Ht 5\' 2"  (1.575 m)   Wt 128 lb 8 oz (58.3 kg)   LMP 01/13/2011   SpO2 99%   BMI 23.50 kg/m    CC: cough Subjective:    Patient ID: Lori Parks, female    DOB: July 20, 1966, 51 y.o.   MRN: [id number]  HPI: Lori Parks is a 51 y.o. female presenting on 06/17/2017 for Acute Visit   I last saw patient 2013. Latest seen in our office 2015.   3 mo h/o cough affecting sleep. Coughing fits with post tussive gagging. Some L scapular pain initially with some persistence. Mild fever initially. Mild ST. Mild dyspnea and wheeze. Treated with zpack and albuterol inhaler 03/2017 which did somewhat help. However has had persistent cough since then.   No further fevers, no ear or tooth pain, PNdrainage, HA. No GERD sxs. No significant allergy symptoms. No night sweats or weight loss.   She has tried OTC antihistamines, motrin, tylenol, several other OTC remedies.   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  H/o PPD positive, treated with 9 months isoniazid (1993). No recent CXR.  Doesn't think has had Tdap recently.   No h/o asthma.  Non smoker.   Relevant past medical, surgical, family and social history reviewed and updated as indicated. Interim medical history since our last visit reviewed. Allergies and medications reviewed and updated. Outpatient Medications Prior to Visit  Medication Sig Dispense Refill  . amoxicillin (AMOXIL) 500 MG capsule Take 1 capsule (500 mg total) by mouth 3 (three) times daily. 30 capsule 0  . cyclobenzaprine (FLEXERIL) 5 MG tablet TAKE 1 TABLET BY MOUTH 3 TIMES A DAY 90 tablet 0  . fish oil-omega-3 fatty acids 1000 MG capsule Take 1 g by mouth daily.    . Multiple Vitamin (MULTIVITAMIN) tablet Take 1 tablet by mouth daily.    . Omeprazole 20 MG TBEC TAKE 1 CAPSULE BY MOUTH DAILY. 60 tablet 2  .  predniSONE (DELTASONE) 20 MG tablet Take two tablets daily for 3 days followed by one tablet daily for 4 days 10 tablet 0   No facility-administered medications prior to visit.      Per HPI unless specifically indicated in ROS section below Review of Systems     Objective:    BP 110/76   Pulse 88   Temp 98.3 F (36.8 C)   Ht 5\' 2"  (1.575 m)   Wt 128 lb 8 oz (58.3 kg)   LMP 01/13/2011   SpO2 99%   BMI 23.50 kg/m   Wt Readings from Last 3 Encounters:  06/17/17 128 lb 8 oz (58.3 kg)  09/27/14 128 lb (58.1 kg)  11/30/12 126 lb (57.2 kg)    Physical Exam  Constitutional: She appears well-developed and well-nourished. No distress.  HENT:  Head: Normocephalic and atraumatic.  Right Ear: Hearing, tympanic membrane, external ear and ear canal normal.  Left Ear: Hearing, tympanic membrane, external ear and ear canal normal.  Nose: No mucosal edema or rhinorrhea. Right sinus exhibits no maxillary sinus tenderness and no frontal sinus tenderness. Left sinus exhibits no maxillary sinus tenderness and no frontal sinus tenderness.  Mouth/Throat: Uvula is midline, oropharynx is clear and moist and mucous membranes are normal. No oropharyngeal exudate, posterior oropharyngeal edema, posterior oropharyngeal erythema or tonsillar abscesses.  Eyes: Conjunctivae and EOM  are normal. Pupils are equal, round, and reactive to light. No scleral icterus.  Neck: Normal range of motion. Neck supple.  Cardiovascular: Normal rate, regular rhythm, normal heart sounds and intact distal pulses.   No murmur heard. Pulmonary/Chest: Effort normal and breath sounds normal. No respiratory distress. She has no wheezes. She has no rales.  Lungs overall clear Dry nagging cough present  Musculoskeletal:  Reproducible tenderness to palpation under left scapula  Lymphadenopathy:    She has no cervical adenopathy.  Skin: Skin is warm and dry. No rash noted.  Nursing note and vitals reviewed.     Assessment &  Plan:   Problem List Items Addressed This Visit    Cough - Primary    Prolonged cough, benign exam today.  Anticipate persistent bronchospasm after initial bronchitis illness.  Risk factors for TB exposure - check CXR today.  Will treat with prednisone course for inflammation, tessalon perls.  No signs of bacterial infection today. Update if not improving with treatment - consider pulm eval of chronic cough. Pt agrees with plan.       Relevant Orders   DG Chest 2 View       Follow up plan: Return if symptoms worsen or fail to improve.  Ria Bush, MD

## 2017-06-18 ENCOUNTER — Encounter: Payer: Self-pay | Admitting: Family Medicine

## 2017-06-18 DIAGNOSIS — R87619 Unspecified abnormal cytological findings in specimens from cervix uteri: Secondary | ICD-10-CM | POA: Insufficient documentation

## 2018-03-29 HISTORY — PX: COLONOSCOPY: SHX174

## 2018-04-10 LAB — HM COLONOSCOPY

## 2019-01-17 ENCOUNTER — Encounter (HOSPITAL_COMMUNITY): Payer: Self-pay

## 2019-01-17 ENCOUNTER — Other Ambulatory Visit: Payer: Self-pay

## 2019-01-17 ENCOUNTER — Emergency Department (HOSPITAL_COMMUNITY)
Admission: EM | Admit: 2019-01-17 | Discharge: 2019-01-17 | Disposition: A | Discharge status: Home / Self Care | Payer: BC Managed Care – PPO | Attending: Emergency Medicine | Admitting: Emergency Medicine

## 2019-01-17 ENCOUNTER — Emergency Department (HOSPITAL_COMMUNITY): Payer: BC Managed Care – PPO

## 2019-01-17 DIAGNOSIS — Z79899 Other long term (current) drug therapy: Secondary | ICD-10-CM | POA: Insufficient documentation

## 2019-01-17 DIAGNOSIS — R1012 Left upper quadrant pain: Secondary | ICD-10-CM | POA: Diagnosis present

## 2019-01-17 DIAGNOSIS — N201 Calculus of ureter: Secondary | ICD-10-CM

## 2019-01-17 LAB — CBC WITH DIFFERENTIAL/PLATELET
Abs Immature Granulocytes: 0.01 10*3/uL (ref 0.00–0.07)
BASOS ABS: 0 10*3/uL (ref 0.0–0.1)
Basophils Relative: 1 %
Eosinophils Absolute: 0.1 10*3/uL (ref 0.0–0.5)
Eosinophils Relative: 2 %
HCT: 39.8 % (ref 36.0–46.0)
HEMOGLOBIN: 13.2 g/dL (ref 12.0–15.0)
IMMATURE GRANULOCYTES: 0 %
LYMPHS PCT: 31 %
Lymphs Abs: 1.4 10*3/uL (ref 0.7–4.0)
MCH: 31.1 pg (ref 26.0–34.0)
MCHC: 33.2 g/dL (ref 30.0–36.0)
MCV: 93.9 fL (ref 80.0–100.0)
Monocytes Absolute: 0.3 10*3/uL (ref 0.1–1.0)
Monocytes Relative: 6 %
NEUTROS PCT: 60 %
Neutro Abs: 2.6 10*3/uL (ref 1.7–7.7)
PLATELETS: 186 10*3/uL (ref 150–400)
RBC: 4.24 MIL/uL (ref 3.87–5.11)
RDW: 12.7 % (ref 11.5–15.5)
WBC: 4.4 10*3/uL (ref 4.0–10.5)
nRBC: 0 % (ref 0.0–0.2)

## 2019-01-17 LAB — URINALYSIS, ROUTINE W REFLEX MICROSCOPIC
BILIRUBIN URINE: NEGATIVE
Glucose, UA: NEGATIVE mg/dL
Ketones, ur: NEGATIVE mg/dL
LEUKOCYTES UA: NEGATIVE
NITRITE: NEGATIVE
Protein, ur: NEGATIVE mg/dL
SPECIFIC GRAVITY, URINE: 1.013 (ref 1.005–1.030)
pH: 7 (ref 5.0–8.0)

## 2019-01-17 LAB — BASIC METABOLIC PANEL
ANION GAP: 7 (ref 5–15)
BUN: 24 mg/dL — AB (ref 6–20)
CO2: 27 mmol/L (ref 22–32)
Calcium: 9 mg/dL (ref 8.9–10.3)
Chloride: 106 mmol/L (ref 98–111)
Creatinine, Ser: 1.14 mg/dL — ABNORMAL HIGH (ref 0.44–1.00)
GFR, EST NON AFRICAN AMERICAN: 55 mL/min — AB (ref >60)
Glucose, Bld: 138 mg/dL — ABNORMAL HIGH (ref 70–99)
Potassium: 3.7 mmol/L (ref 3.5–5.1)
SODIUM: 140 mmol/L (ref 135–145)

## 2019-01-17 MED ORDER — METOCLOPRAMIDE HCL 5 MG/ML IJ SOLN
INTRAMUSCULAR | Status: AC
  Filled 2019-01-17: qty 2

## 2019-01-17 MED ORDER — HYDROMORPHONE HCL 1 MG/ML IJ SOLN
1.0000 mg | Freq: Once | INTRAMUSCULAR | Status: AC
  Administered 2019-01-17: 1 mg via INTRAVENOUS
  Filled 2019-01-17: qty 1

## 2019-01-17 MED ORDER — ONDANSETRON 4 MG PO TBDP: 4.0000 mg | ORAL_TABLET | Freq: Three times a day (TID) | ORAL | 1 refills | Status: DC | PRN

## 2019-01-17 MED ORDER — KETOROLAC TROMETHAMINE 30 MG/ML IJ SOLN
30.0000 mg | Freq: Once | INTRAMUSCULAR | Status: AC
  Administered 2019-01-17: 30 mg via INTRAVENOUS
  Filled 2019-01-17: qty 1

## 2019-01-17 MED ORDER — NAPROXEN 500 MG PO TABS: 500.0000 mg | ORAL_TABLET | Freq: Two times a day (BID) | ORAL | 0 refills | Status: DC

## 2019-01-17 MED ORDER — SODIUM CHLORIDE 0.9 % IV SOLN
INTRAVENOUS | Status: DC
  Administered 2019-01-17: 100 mL/h via INTRAVENOUS

## 2019-01-17 MED ORDER — METOCLOPRAMIDE HCL 5 MG/ML IJ SOLN
10.0000 mg | Freq: Once | INTRAMUSCULAR | Status: AC
  Administered 2019-01-17: 10 mg via INTRAVENOUS

## 2019-01-17 MED ORDER — SODIUM CHLORIDE 0.9 % IV BOLUS
500.0000 mL | Freq: Once | INTRAVENOUS | Status: AC
  Administered 2019-01-17: 500 mL via INTRAVENOUS

## 2019-01-17 MED ORDER — METOCLOPRAMIDE HCL 10 MG PO TABS: 10.0000 mg | ORAL_TABLET | Freq: Once | ORAL | Status: DC

## 2019-01-17 MED ORDER — ONDANSETRON HCL 4 MG/2ML IJ SOLN
4.0000 mg | Freq: Once | INTRAMUSCULAR | Status: AC
  Administered 2019-01-17: 4 mg via INTRAVENOUS
  Filled 2019-01-17: qty 2

## 2019-01-17 NOTE — ED Provider Notes (Addendum)
[facility] EMERGENCY DEPARTMENT Provider Note   CSN: [id number] Arrival date & time: 01/17/19  6503     History   Chief Complaint Chief Complaint  Patient presents with  . Flank Pain    HPI Lori Parks is a 53 y.o. female.  Patient with acute onset of left flank pain about 45 minutes prior to arrival.  Patient without a history of renal stones.  Did have vomiting.  Pain was severe.  Patient is postmenopausal.  History of ovarian cyst in the past.     Past Medical History:  Diagnosis Date  . Complete proximal hamstring tendon rupture 03/28/2012  . Epigastric hernia 01/2015   pending surgery (Dr Renee Rival at [facility])  . Fibroids    mult small myomata (Dr. Neysa Bonito, OBGYN)  . Galactorrhea    s/p full w/u including CT by OBGYN, since age 47yo  . GI bleed 11/30/2012  . Hamstring injury 01/2012   recurrent L prox hamstring avulsion s/p surgery  . Irregular menses   . Miscarriage 1990s  . Ovarian cyst   . 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 Abnormal Pap smear of cervix 06/18/2017  . Cough 06/17/2017  . Patellar subluxation 02/15/2015  . Epigastric hernia 01/29/2015  . GI bleed 11/30/2012  . Complete proximal hamstring tendon rupture 03/28/2012  . Ventral hernia 02/20/2012  . Routine general medical examination at a health care facility 04/14/2011  . Right ankle pain 04/14/2011  . Galactorrhea 04/14/2011    Past Surgical History:  Procedure Laterality Date  . COLPOSCOPY  2011   WNL, abnl pap CIN1 11/2009, LGSIL neg HPV 10/2010, rec rpt 6 mo  . COLPOSCOPY  03/2015   CIN1 (Dr Neysa Bonito at [facility])  . ENDOMETRIAL BIOPSY  10/2010   benign proliferative biopsy, not menopausal  . EPIGASTRIC HERNIA REPAIR  04/2015   [facility]  . left proximal hamstring avulsion repair w/ sciatic neurolysis  x2 (02/2012)   with recurrent avulsion (Dr. Deatra Ina)     OB History   No  obstetric history on file.      Home Medications    Prior to Admission medications   Medication Sig Start Date End Date Taking? Authorizing Provider  acetaminophen (TYLENOL) 325 MG tablet Take 650 mg by mouth every 6 (six) hours as needed.   Yes [provider]  Pseudoephedrine HCl (SUDAFED 12 HOUR PO) Take 1 tablet by mouth 2 (two) times daily.   Yes [provider]  benzonatate (TESSALON) 100 MG capsule Take 1 capsule (100 mg total) by mouth 3 (three) times daily as needed for cough. Patient not taking: Reported on 01/17/2019 06/17/17   Ria Bush, MD  predniSONE (DELTASONE) 20 MG tablet Take two tablets daily for 3 days followed by one tablet daily for 4 days Patient not taking: Reported on 01/17/2019 06/17/17   Ria Bush, MD    Family History Family History  Problem Relation Age of Onset  . Arthritis Mother   . Hypertension Mother   . Stroke Maternal Grandmother   . Hypertension Maternal Grandmother   . Coronary artery disease Neg Hx   . Cancer Neg Hx     Social History Social History   Tobacco Use  . Smoking status: Never Smoker  . Smokeless tobacco: Never Used  Substance Use Topics  . Alcohol use: No  . Drug use: No     Allergies  Bactrim [sulfamethoxazole-trimethoprim] and Codeine   Review of Systems Review of Systems  Constitutional: Negative for chills and fever.  HENT: Negative for rhinorrhea and sore throat.   Eyes: Negative for visual disturbance.  Respiratory: Negative for cough and shortness of breath.   Cardiovascular: Negative for chest pain and leg swelling.  Gastrointestinal: Positive for nausea and vomiting. Negative for abdominal pain and diarrhea.  Genitourinary: Positive for flank pain. Negative for dysuria.  Musculoskeletal: Negative for back pain and neck pain.  Skin: Negative for rash.  Neurological: Negative for dizziness, light-headedness and headaches.  Hematological: Does not bruise/bleed easily.    Psychiatric/Behavioral: Negative for confusion.     Physical Exam Updated Vital Signs BP (!) 151/97 (BP Location: Right Arm)   Pulse 95   Temp 97.8 F (36.6 C) (Oral)   Resp 20   Wt 56.7 kg   LMP 01/13/2011   SpO2 99%   BMI 22.86 kg/m   Physical Exam Vitals signs and nursing note reviewed.  Constitutional:      General: She is not in acute distress.    Appearance: She is well-developed.  HENT:     Head: Normocephalic and atraumatic.  Eyes:     Extraocular Movements: Extraocular movements intact.     Conjunctiva/sclera: Conjunctivae normal.     Pupils: Pupils are equal, round, and reactive to light.  Neck:     Musculoskeletal: Neck supple.  Cardiovascular:     Rate and Rhythm: Normal rate and regular rhythm.     Heart sounds: No murmur.  Pulmonary:     Effort: Pulmonary effort is normal. No respiratory distress.     Breath sounds: Normal breath sounds.  Abdominal:     Palpations: Abdomen is soft.     Tenderness: There is no abdominal tenderness.  Musculoskeletal: Normal range of motion.  Skin:    General: Skin is warm and dry.     Capillary Refill: Capillary refill takes less than 2 seconds.  Neurological:     General: No focal deficit present.     Mental Status: She is alert and oriented to person, place, and time.      ED Treatments / Results  Labs (all labs ordered are listed, but only abnormal results are displayed) Labs Reviewed  URINALYSIS, ROUTINE W REFLEX MICROSCOPIC - Abnormal; Notable for the following components:      Result Value   APPearance HAZY (*)    Hgb urine dipstick LARGE (*)    Bacteria, UA FEW (*)    All other components within normal limits  BASIC METABOLIC PANEL - Abnormal; Notable for the following components:   Glucose, Bld 138 (*)    BUN 24 (*)    Creatinine, Ser 1.14 (*)    GFR calc non Af Amer 55 (*)    All other components within normal limits  CBC WITH DIFFERENTIAL/PLATELET    EKG None  Radiology Ct Renal Stone  Study  Result Date: 01/17/2019 CLINICAL DATA:  Left-sided flank and groin pain since this morning. Vomiting. History of hernia repair. EXAM: CT ABDOMEN AND PELVIS WITHOUT CONTRAST TECHNIQUE: Multidetector CT imaging of the abdomen and pelvis was performed following the standard protocol without IV contrast. COMPARISON:  None. FINDINGS: The lack of intravenous contrast limits the ability to evaluate solid abdominal organs. Lower chest: Limited visualization of the lower thorax demonstrates minimal dependent subpleural ground-glass atelectasis. No discrete focal airspace opacities. No pleural effusion. Normal heart size.  No pericardial effusion. Hepatobiliary: Normal hepatic contour. Normal noncontrast appearance of  the gallbladder given degree distention. No radiopaque gallstones. No ascites. Pancreas: Normal noncontrast appearance of the pancreas. Spleen: Normal noncontrast appearance of the spleen. Note is made of a small splenule. Adrenals/Urinary Tract: There is a punctate (approximately 0 point 2 cm) stone lying dependently within the urinary bladder. This finding is associated with mild upstream left-sided ureterectasis and pelvicaliectasis and likely represents a recently passed left-sided ureteral stone. There is an additional punctate (approximately 1 mm) nonobstructing stone within the inferior pole of the left kidney (coronal image 43, series 5). Punctate bilateral gonadal phleboliths are seen bilaterally. No definite right-sided renal stones. No urinary obstruction. There is a minimal amount of asymmetric left-sided perinephric stranding. Normal noncontrast appearance the bilateral adrenal glands. Otherwise normal appearance of the urinary bladder given degree of distention. Stomach/Bowel: Moderate to large colonic stool burden without evidence of enteric obstruction. Normal noncontrast appearance of the terminal ileum and the retrocecal appendix. No pneumoperitoneum, pneumatosis or portal venous gas.  Vascular/Lymphatic: Normal caliber of the abdominal aorta. No bulky retroperitoneal, pelvic or inguinal lymphadenopathy on this noncontrast examination. Reproductive: Normal noncontrast appearance of the pelvic organs. No discrete adnexal lesion on this noncontrast examination. Punctate phleboliths are seen with the lower pelvis bilaterally. Other: Tiny mesenteric fat containing periumbilical hernia. Musculoskeletal: No acute or aggressive osseous abnormalities ossicle adjacent to the right greater trochanter likely represents sequela of remote avulsive injury. IMPRESSION: 1. Mild left-sided ureterectasis, pelvicaliectasis and asymmetric left-sided perinephric stranding with punctate (approximately 0.2 cm) stone within the urinary bladder favored to represent a recently passed left-sided ureteral stone. 2. Additional punctate (approximately 1 mm) nonobstructing left-sided renal stone. 3. No evidence of right-sided nephrolithiasis or urinary obstruction. Electronically Signed   By: Sandi Mariscal M.D.   On: 01/17/2019 11:09    Procedures Procedures (including critical care time)  Medications Ordered in ED Medications  0.9 %  sodium chloride infusion (100 mL/hr Intravenous New Bag/Given 01/17/19 1013)  sodium chloride 0.9 % bolus 500 mL (0 mLs Intravenous Stopped 01/17/19 1005)  ondansetron (ZOFRAN) injection 4 mg (4 mg Intravenous Given 01/17/19 0928)  ketorolac (TORADOL) 30 MG/ML injection 30 mg (30 mg Intravenous Given 01/17/19 0926)  HYDROmorphone (DILAUDID) injection 1 mg (1 mg Intravenous Given 01/17/19 1005)  metoCLOPramide (REGLAN) injection 10 mg (10 mg Intravenous Given 01/17/19 1012)     Initial Impression / Assessment and Plan / ED Course  I have reviewed the triage vital signs and the nursing notes.  Pertinent labs & imaging results that were available during my care of the patient were reviewed by me and considered in my medical decision making (see chart for details).     Urinalysis  consistent with some hematuria.  CT scan shows evidence of a recently passed left ureteral stone.  Another stone up in the kidney.  But not in the ureter system.  This should explain patient's symptoms.  Also her pain completely went away although she did receive Toradol and she did receive hydromorphone.  But do not feel that these were directly related to the relief in the pain probably it was passing of the stone.  Patient given referral information to follow-up with [facility].  Work note provided.  Final Clinical Impressions(s) / ED Diagnoses   Final diagnoses:  Left ureteral stone    ED Discharge Orders    None       Fredia Sorrow, MD 01/17/19 1212   Addendum: Prescriptions sent in to [facility] for Naprosyn and Zofran.  Patient's creatinine slightly up but overall  renal function is still normal.  So Naprosyn should be okay.   Fredia Sorrow, MD 01/17/19 [phone]

## 2019-01-17 NOTE — Discharge Instructions (Addendum)
CT scan consistent with a left ureteral stone that has passed.  No complications.  Labs without any significant abnormalities.  Recommend routine follow-up with urology.  Return for any new or worse symptoms.  Work note provided.

## 2019-01-17 NOTE — ED Triage Notes (Signed)
Pt reports she was driving and had sudden onset left flank area pain. Reports vomitng

## 2019-06-13 IMAGING — CT CT RENAL STONE PROTOCOL
2 of 4 series · 15 of 46 positions shown, 17 images · non-contrast
Comparison: None.

CLINICAL DATA: Left-sided flank and groin pain since this morning.
Vomiting. History of hernia repair.

EXAM:
CT ABDOMEN AND PELVIS WITHOUT CONTRAST
TECHNIQUE: Multidetector CT imaging of the abdomen and pelvis was performed
following the standard protocol without IV contrast.

[Series 2: axial st · axial · 0.72mm/px · z∈[-792,-398]mm · 12 of 87 slices shown, 14 images]
[im 4/87  soft-tissue]
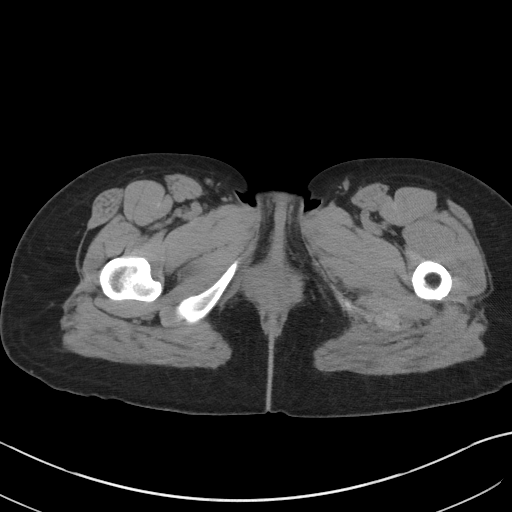
[im 4/87  bone]
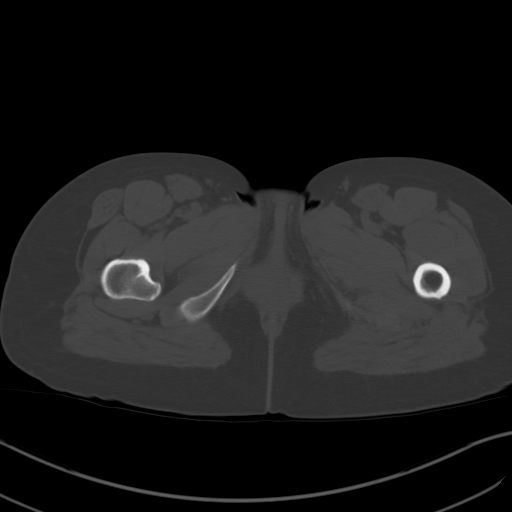
[im 11/87  soft-tissue]
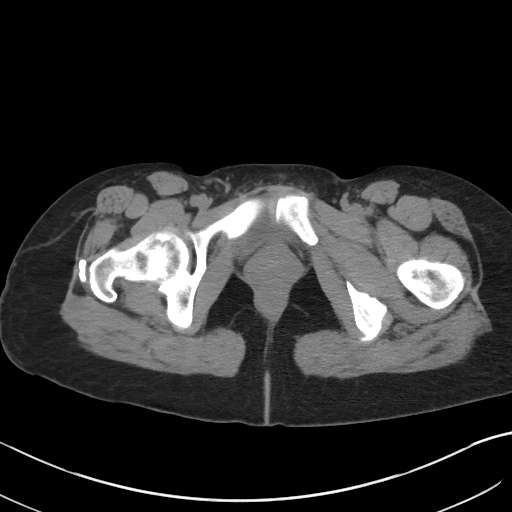
[im 18/87  soft-tissue]
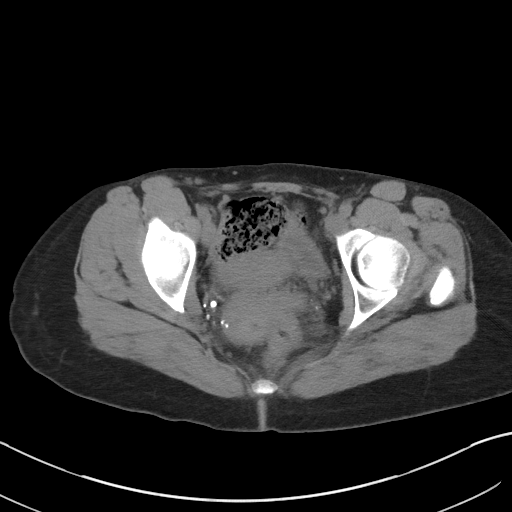
[im 26/87  soft-tissue]
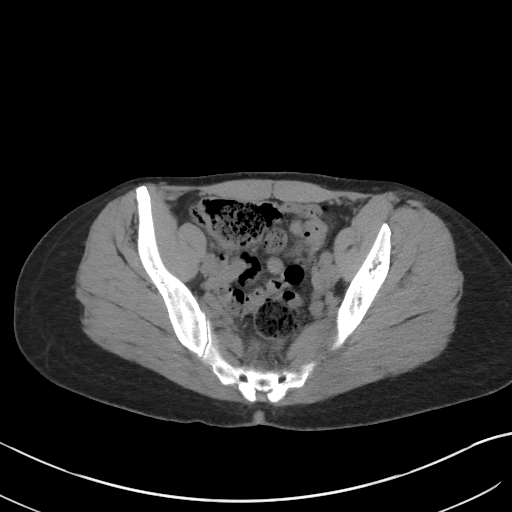
[im 33/87  soft-tissue]
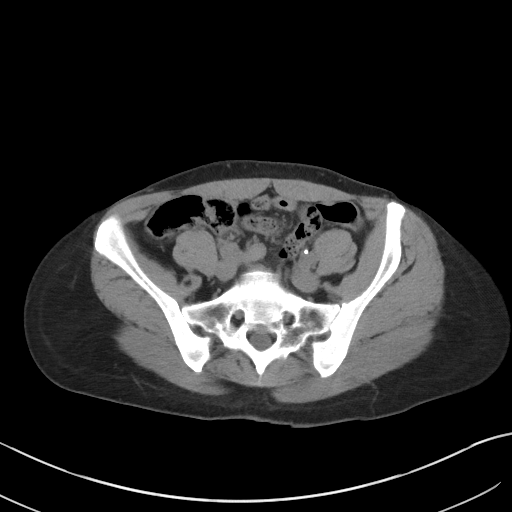
[im 40/87  soft-tissue]
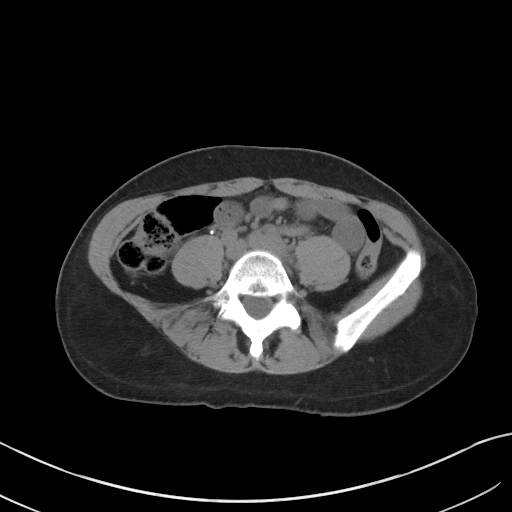
[im 47/87  soft-tissue]
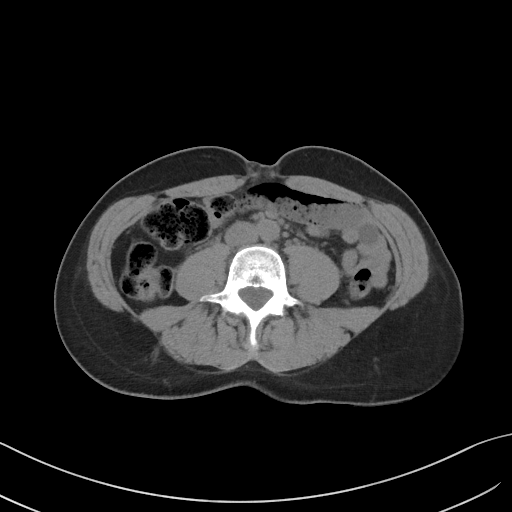
[im 54/87  soft-tissue]
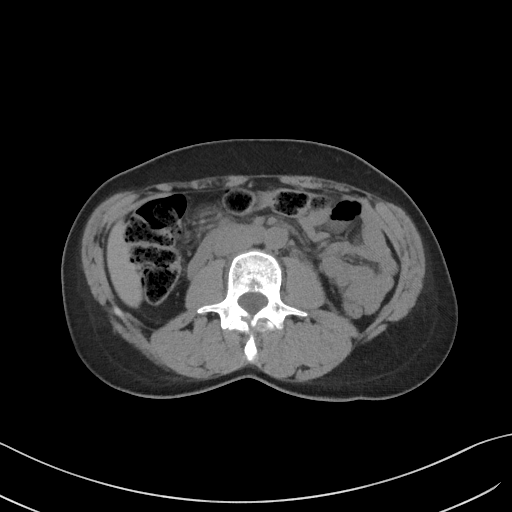
[im 61/87  soft-tissue]
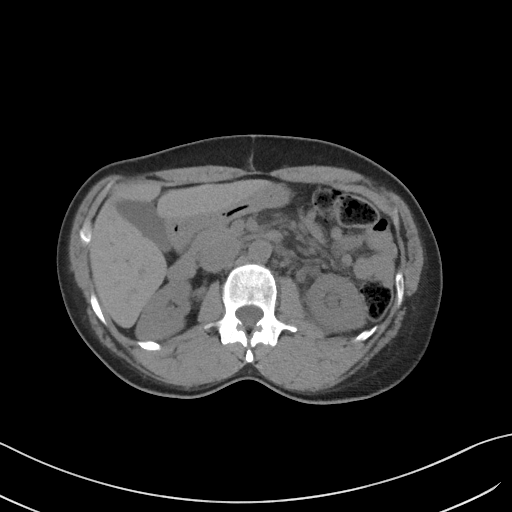
[im 61/87  bone]
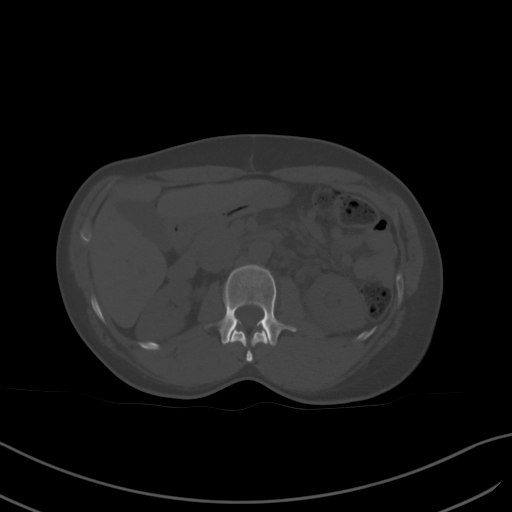
[im 69/87  soft-tissue]
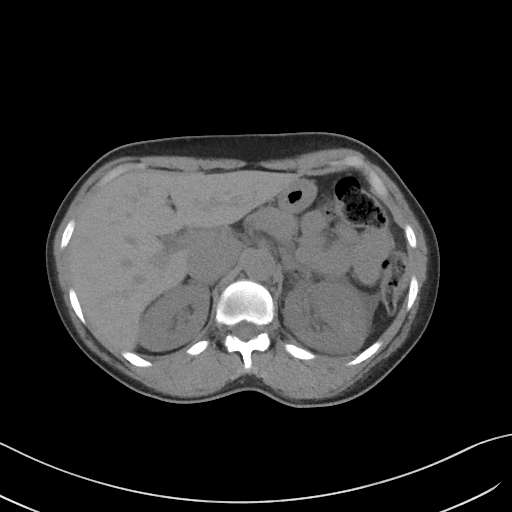
[im 76/87  soft-tissue]
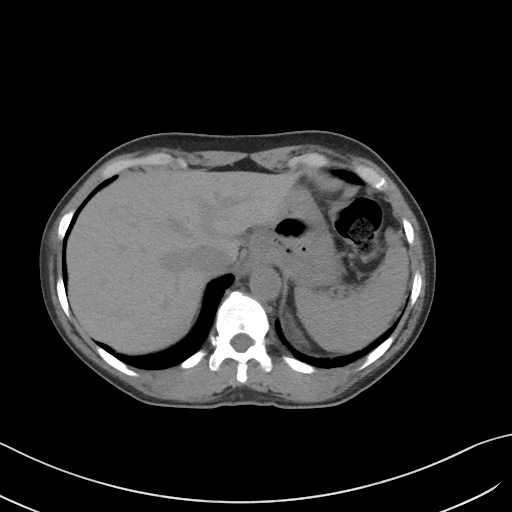
[im 83/87  soft-tissue]
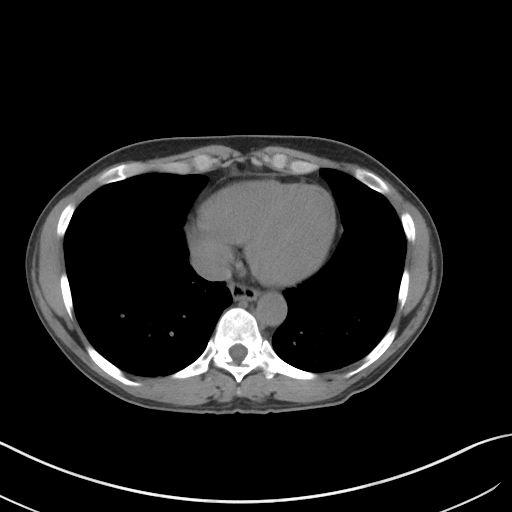

[Series 5: coronal st · coronal · 0.73mm/px · 3 of 68 slices shown]
[im 23/68  soft-tissue]
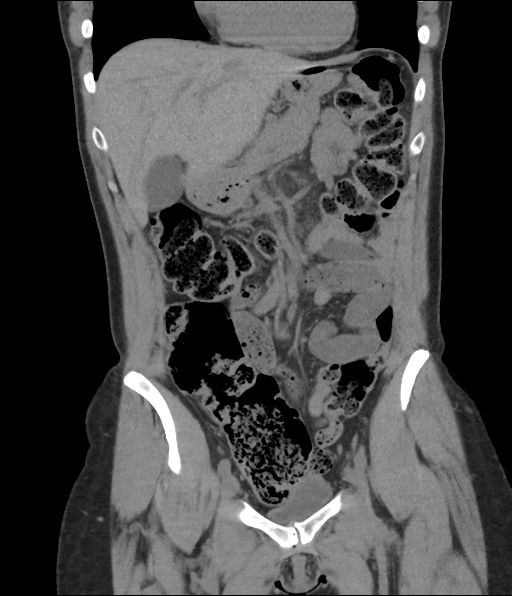
[im 30/68  soft-tissue]
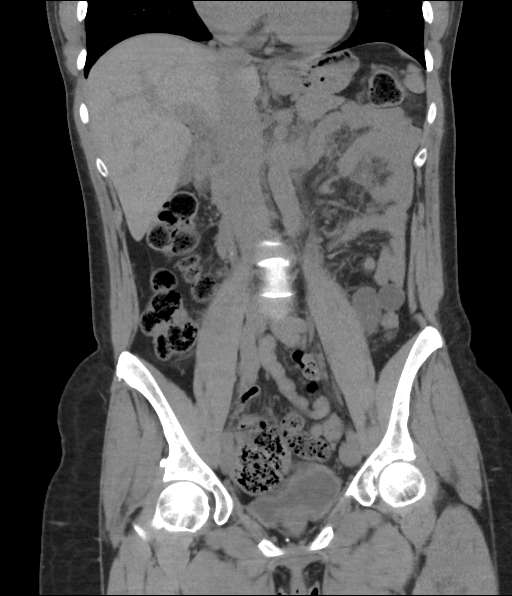
[im 38/68  soft-tissue]
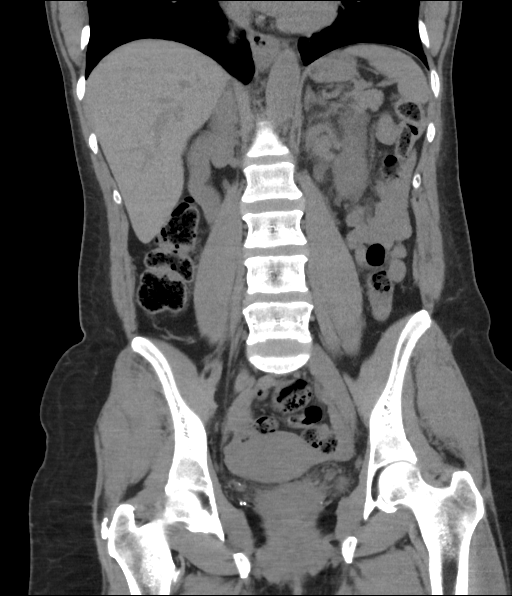

[15 of 46 positions shown; findings below may reference images not displayed]

FINDINGS: The lack of intravenous contrast limits the ability to evaluate
solid abdominal organs.

Lower chest: Limited visualization of the lower thorax demonstrates
minimal dependent subpleural ground-glass atelectasis. No discrete
focal airspace opacities. No pleural effusion.

Normal heart size.  No pericardial effusion.

Hepatobiliary: Normal hepatic contour. Normal noncontrast appearance
of the gallbladder given degree distention. No radiopaque
gallstones. No ascites.

Pancreas: Normal noncontrast appearance of the pancreas.

Spleen: Normal noncontrast appearance of the spleen. Note is made of
a small splenule.

Adrenals/Urinary Tract: There is a punctate (approximately 0 point 2
cm) stone lying dependently within the urinary bladder. This finding
is associated with mild upstream left-sided ureterectasis and
pelvicaliectasis and likely represents a recently passed left-sided
ureteral stone. There is an additional punctate (approximately 1 mm)
nonobstructing stone within the inferior pole of the left kidney
(coronal image 43, series 5). Punctate bilateral gonadal phleboliths
are seen bilaterally. No definite right-sided renal stones. No
urinary obstruction.

There is a minimal amount of asymmetric left-sided perinephric
stranding.

Normal noncontrast appearance the bilateral adrenal glands.

Otherwise normal appearance of the urinary bladder given degree of
distention.

Stomach/Bowel: Moderate to large colonic stool burden without
evidence of enteric obstruction. Normal noncontrast appearance of
the terminal ileum and the retrocecal appendix. No pneumoperitoneum,
pneumatosis or portal venous gas.

Vascular/Lymphatic: Normal caliber of the abdominal aorta.

No bulky retroperitoneal, pelvic or inguinal lymphadenopathy on this
noncontrast examination.

Reproductive: Normal noncontrast appearance of the pelvic organs. No
discrete adnexal lesion on this noncontrast examination. Punctate
phleboliths are seen with the lower pelvis bilaterally.

Other: Tiny mesenteric fat containing periumbilical hernia.

Musculoskeletal: No acute or aggressive osseous abnormalities
ossicle adjacent to the right greater trochanter likely represents
sequela of remote avulsive injury.
IMPRESSION: 1. Mild left-sided ureterectasis, pelvicaliectasis and asymmetric
left-sided perinephric stranding with punctate (approximately
cm) stone within the urinary bladder favored to represent a recently
passed left-sided ureteral stone.
2. Additional punctate (approximately 1 mm) nonobstructing
left-sided renal stone.
3. No evidence of right-sided nephrolithiasis or urinary
obstruction.

## 2019-07-08 ENCOUNTER — Ambulatory Visit (INDEPENDENT_AMBULATORY_CARE_PROVIDER_SITE_OTHER): Payer: BC Managed Care – PPO | Admitting: Family Medicine

## 2019-07-08 ENCOUNTER — Telehealth: Payer: Self-pay | Admitting: Family Medicine

## 2019-07-08 ENCOUNTER — Other Ambulatory Visit: Payer: Self-pay

## 2019-07-08 ENCOUNTER — Encounter: Payer: Self-pay | Admitting: Family Medicine

## 2019-07-08 VITALS — Temp 97.8°F | Ht 62.0 in | Wt 125.0 lb

## 2019-07-08 DIAGNOSIS — M25542 Pain in joints of left hand: Secondary | ICD-10-CM

## 2019-07-08 DIAGNOSIS — M25541 Pain in joints of right hand: Secondary | ICD-10-CM

## 2019-07-08 DIAGNOSIS — R5383 Other fatigue: Secondary | ICD-10-CM

## 2019-07-08 DIAGNOSIS — R5381 Other malaise: Secondary | ICD-10-CM | POA: Diagnosis not present

## 2019-07-08 DIAGNOSIS — S30860A Insect bite (nonvenomous) of lower back and pelvis, initial encounter: Secondary | ICD-10-CM

## 2019-07-08 DIAGNOSIS — T148XXA Other injury of unspecified body region, initial encounter: Secondary | ICD-10-CM

## 2019-07-08 DIAGNOSIS — W57XXXA Bitten or stung by nonvenomous insect and other nonvenomous arthropods, initial encounter: Secondary | ICD-10-CM

## 2019-07-08 NOTE — Assessment & Plan Note (Addendum)
Describes inflammatory nodules last week which have since resolved. Endorses h/o raynaud's phenomenon. Check ESR, ANA, RF.

## 2019-07-08 NOTE — Telephone Encounter (Signed)
Ordered for [facility] and released. plz notify pt  Lori Parks it's not letting me order lyme disease testing for [facility] (borrelia burgdorferi titers) - can you order that for me? Thanks.

## 2019-07-08 NOTE — Assessment & Plan Note (Signed)
Endorses spontaneous bruising to lower extremities - will check CBC.

## 2019-07-08 NOTE — Assessment & Plan Note (Addendum)
Ongoing - check labwork for reversible causes. Add B12, D.

## 2019-07-08 NOTE — Telephone Encounter (Signed)
Spoke with pt relaying Dr. G's message. Pt expresses her thanks.  

## 2019-07-08 NOTE — Addendum Note (Signed)
Addended by: Ellamae Sia on: 07/08/2019 11:06 AM   Modules accepted: Orders

## 2019-07-08 NOTE — Progress Notes (Signed)
Virtual visit completed through [email or web address]. Due to national recommendations of social distancing due to COVID-19, a virtual visit is felt to be most appropriate for this patient at this time. Reviewed limitations of a virtual visit.   Patient location: in her car Provider location: [place] [facility], office If any vitals were documented, they were collected by patient at home unless specified below.    Temp 97.8 F (36.6 C)   Ht _0  (1.575 m)   Wt 125 lb (56.7 kg)   LMP 01/13/2011   BMI 22.86 kg/m    CC: bruising Subjective:    Patient ID: Lori Parks, female    DOB: 1966-09-14, 53 y.o.   MRN: [id number]  HPI: Lori Parks is a 53 y.o. female presenting on 07/08/2019 for Bruises (C/o bruises on legs, fatigue and swelling. Sxs come and go. Started a few months ago. Concerned may be RA. )   ER physician.   Over last few months, noted bruising to legs, also noted nodules behind L knee. This has been associated with feverish feeling, fatigue, noticed painful nodules on joints of bilateral hands (MCP and PIP and DIP) which have since resolved. Hands felt warm as well. No other joints affected.   Bruising to lower legs has recurred - spontaneous, no noted trauma/fatigue. One bruise also noted on abdomen. Noted some petechia on thighs.  Ongoing malaise/fatigue.  Endorses h/o raynaud's, worse when she was younger. No problems since.  No fevers/chills. No HA. No blood in stools, dyspnea, chest pain, n/v/d.  Tick bite 2 wks ago, bruising present before this.   Fmhx osteoarthritis (mother).  Menopausal. LMP years ago     Relevant past medical, surgical, family and social history reviewed and updated as indicated. Interim medical history since our last visit reviewed. Allergies and medications reviewed and updated. Outpatient Medications Prior to Visit  Medication Sig Dispense Refill  . acetaminophen (TYLENOL) 325 MG tablet Take 650 mg by mouth every 6 (six) hours  as needed.    . benzonatate (TESSALON) 100 MG capsule Take 1 capsule (100 mg total) by mouth 3 (three) times daily as needed for cough. (Patient not taking: Reported on 01/17/2019) 30 capsule 0  . naproxen (NAPROSYN) 500 MG tablet Take 1 tablet (500 mg total) by mouth 2 (two) times daily. 14 tablet 0  . ondansetron (ZOFRAN ODT) 4 MG disintegrating tablet Take 1 tablet (4 mg total) by mouth every 8 (eight) hours as needed. 10 tablet 1  . predniSONE (DELTASONE) 20 MG tablet Take two tablets daily for 3 days followed by one tablet daily for 4 days (Patient not taking: Reported on 01/17/2019) 10 tablet 0  . Pseudoephedrine HCl (SUDAFED 12 HOUR PO) Take 1 tablet by mouth 2 (two) times daily.     No facility-administered medications prior to visit.      Per HPI unless specifically indicated in ROS section below Review of Systems Objective:    Temp 97.8 F (36.6 C)   Ht _1  (1.575 m)   Wt 125 lb (56.7 kg)   LMP 01/13/2011   BMI 22.86 kg/m   Wt Readings from Last 3 Encounters:  07/08/19 125 lb (56.7 kg)  01/17/19 125 lb (56.7 kg)  06/17/17 128 lb 8 oz (58.3 kg)     Physical exam: Gen: alert, NAD, not ill appearing Pulm: speaks in complete sentences without increased work of breathing Psych: normal mood, normal thought content  Unable to do skin exam due to poor video connection  Results for orders placed or performed during the hospital encounter of 01/17/19  Urinalysis, Routine w reflex microscopic- may I&O cath if menses  Result Value Ref Range   Color, Urine YELLOW YELLOW   APPearance HAZY (A) CLEAR   Specific Gravity, Urine 1.013 1.005 - 1.030   pH 7.0 5.0 - 8.0   Glucose, UA NEGATIVE NEGATIVE mg/dL   Hgb urine dipstick LARGE (A) NEGATIVE   Bilirubin Urine NEGATIVE NEGATIVE   Ketones, ur NEGATIVE NEGATIVE mg/dL   Protein, ur NEGATIVE NEGATIVE mg/dL   Nitrite NEGATIVE NEGATIVE   Leukocytes, UA NEGATIVE NEGATIVE   RBC / HPF 21-50 0 - 5 RBC/hpf   WBC, UA 0-5 0 - 5 WBC/hpf    Bacteria, UA FEW (A) NONE SEEN   Squamous Epithelial / LPF 0-5 0 - 5   Mucus PRESENT    Budding Yeast PRESENT   CBC with Differential/Platelet  Result Value Ref Range   WBC 4.4 4.0 - 10.5 K/uL   RBC 4.24 3.87 - 5.11 MIL/uL   Hemoglobin 13.2 12.0 - 15.0 g/dL   HCT 39.8 36.0 - 46.0 %   MCV 93.9 80.0 - 100.0 fL   MCH 31.1 26.0 - 34.0 pg   MCHC 33.2 30.0 - 36.0 g/dL   RDW 12.7 11.5 - 15.5 %   Platelets 186 150 - 400 K/uL   nRBC 0.0 0.0 - 0.2 %   Neutrophils Relative % 60 %   Neutro Abs 2.6 1.7 - 7.7 K/uL   Lymphocytes Relative 31 %   Lymphs Abs 1.4 0.7 - 4.0 K/uL   Monocytes Relative 6 %   Monocytes Absolute 0.3 0.1 - 1.0 K/uL   Eosinophils Relative 2 %   Eosinophils Absolute 0.1 0.0 - 0.5 K/uL   Basophils Relative 1 %   Basophils Absolute 0.0 0.0 - 0.1 K/uL   Immature Granulocytes 0 %   Abs Immature Granulocytes 0.01 0.00 - 0.07 K/uL  Basic metabolic panel  Result Value Ref Range   Sodium 140 135 - 145 mmol/L   Potassium 3.7 3.5 - 5.1 mmol/L   Chloride 106 98 - 111 mmol/L   CO2 27 22 - 32 mmol/L   Glucose, Bld 138 (H) 70 - 99 mg/dL   BUN 24 (H) 6 - 20 mg/dL   Creatinine, Ser 1.14 (H) 0.44 - 1.00 mg/dL   Calcium 9.0 8.9 - 10.3 mg/dL   GFR calc non Af Amer 55 (L) >60 mL/min   GFR calc Af Amer >60 >60 mL/min   Anion gap 7 5 - 15   Assessment & Plan:   Problem List Items Addressed This Visit    Malaise and fatigue    Ongoing - check labwork for reversible causes. Add B12, D.       Relevant Orders   TSH   Sedimentation rate   ANA   Rheumatoid factor   B. burgdorfi antibodies by WB   VITAMIN D 25 Hydroxy (Vit-D Deficiency, Fractures)   Vitamin B12   Bruising - Primary    Endorses spontaneous bruising to lower extremities - will check CBC.       Relevant Orders   Comprehensive metabolic panel   CBC with Differential/Platelet   Arthralgia of both hands    Describes inflammatory nodules last week which have since resolved. Endorses h/o raynaud's phenomenon.  Check ESR, ANA, RF.       Relevant Orders   TSH   Sedimentation rate   ANA   Rheumatoid factor   B.  burgdorfi antibodies by WB    Other Visit Diagnoses    Tick bite of back, initial encounter       Relevant Orders   B. burgdorfi antibodies by WB       No orders of the defined types were placed in this encounter.  Orders Placed This Encounter  Procedures  . Comprehensive metabolic panel    Standing Status:   Future    Standing Expiration Date:   07/07/2020  . TSH    Standing Status:   Future    Standing Expiration Date:   07/07/2020  . CBC with Differential/Platelet    Standing Status:   Future    Standing Expiration Date:   07/07/2020  . Sedimentation rate    Standing Status:   Future    Standing Expiration Date:   07/07/2020  . ANA    Standing Status:   Future    Standing Expiration Date:   07/07/2020  . Rheumatoid factor    Standing Status:   Future    Standing Expiration Date:   07/07/2020  . B. burgdorfi antibodies by WB    Standing Status:   Future    Standing Expiration Date:   07/07/2020  . VITAMIN D 25 Hydroxy (Vit-D Deficiency, Fractures)    Standing Status:   Future    Standing Expiration Date:   07/07/2020  . Vitamin B12    Standing Status:   Future    Standing Expiration Date:   07/07/2020    I discussed the assessment and treatment plan with the patient. The patient was provided an opportunity to ask questions and all were answered. The patient agreed with the plan and demonstrated an understanding of the instructions. The patient was advised to call back or seek an in-person evaluation if the symptoms worsen or if the condition fails to improve as anticipated.  Follow up plan: No follow-ups on file.  Ria Bush, MD

## 2019-07-08 NOTE — Telephone Encounter (Signed)
Pt wanted lab order sent to  [facility] in [place] She wants to have labs done today  [place]  Please let pt know when this order has been sent [phone]

## 2019-07-08 NOTE — Telephone Encounter (Signed)
Patient called today requesting a copy of her insurance card be faxed to [place] for her labs to be done  This has been faxed. FYI

## 2019-07-08 NOTE — Telephone Encounter (Signed)
Terri ordered lyme test through [facility]. Lattie Haw can you call pt and let her know she can go to [place] for blood draw? Thanks

## 2019-07-11 ENCOUNTER — Other Ambulatory Visit: Payer: Self-pay | Admitting: Family Medicine

## 2019-07-11 ENCOUNTER — Other Ambulatory Visit: Payer: BC Managed Care – PPO

## 2019-07-11 ENCOUNTER — Encounter: Payer: Self-pay | Admitting: Family Medicine

## 2019-07-11 DIAGNOSIS — R748 Abnormal levels of other serum enzymes: Secondary | ICD-10-CM | POA: Insufficient documentation

## 2019-07-11 DIAGNOSIS — E559 Vitamin D deficiency, unspecified: Secondary | ICD-10-CM

## 2019-07-11 LAB — CBC WITH DIFFERENTIAL/PLATELET
Basophils Absolute: 0 10*3/uL (ref 0.0–0.2)
Basos: 1 %
EOS (ABSOLUTE): 0.1 10*3/uL (ref 0.0–0.4)
Eos: 2 %
Hematocrit: 37.9 % (ref 34.0–46.6)
Hemoglobin: 13.1 g/dL (ref 11.1–15.9)
Lymphocytes Absolute: 2 10*3/uL (ref 0.7–3.1)
Lymphs: 50 %
MCH: 31.3 pg (ref 26.6–33.0)
MCHC: 34.6 g/dL (ref 31.5–35.7)
MCV: 91 fL (ref 79–97)
Monocytes Absolute: 0.3 10*3/uL (ref 0.1–0.9)
Monocytes: 7 %
Neutrophils Absolute: 1.6 10*3/uL (ref 1.4–7.0)
Neutrophils: 40 %
Platelets: 173 10*3/uL (ref 150–450)
RBC: 4.19 x10E6/uL (ref 3.77–5.28)
RDW: 13.4 % (ref 11.7–15.4)
WBC: 4 10*3/uL (ref 3.4–10.8)

## 2019-07-11 LAB — VITAMIN D 25 HYDROXY (VIT D DEFICIENCY, FRACTURES): Vit D, 25-Hydroxy: 14.5 ng/mL — ABNORMAL LOW (ref 30.0–100.0)

## 2019-07-11 LAB — RHEUMATOID FACTOR: Rheumatoid fact SerPl-aCnc: <10 IU/mL (ref 0.0–13.9)

## 2019-07-11 LAB — COMPREHENSIVE METABOLIC PANEL
ALT: 12 IU/L (ref 0–32)
AST: 17 IU/L (ref 0–40)
Albumin/Globulin Ratio: 1.9 (ref 1.2–2.2)
Albumin: 4.4 g/dL (ref 3.8–4.9)
Alkaline Phosphatase: 33 IU/L — ABNORMAL LOW (ref 39–117)
BUN/Creatinine Ratio: 22 (ref 9–23)
BUN: 20 mg/dL (ref 6–24)
Bilirubin Total: 0.3 mg/dL (ref 0.0–1.2)
CO2: 27 mmol/L (ref 20–29)
Calcium: 9.7 mg/dL (ref 8.7–10.2)
Chloride: 105 mmol/L (ref 96–106)
Creatinine, Ser: 0.92 mg/dL (ref 0.57–1.00)
GFR calc Af Amer: 82 mL/min/{1.73_m2} (ref >59)
GFR calc non Af Amer: 71 mL/min/{1.73_m2} (ref >59)
Globulin, Total: 2.3 g/dL (ref 1.5–4.5)
Glucose: 95 mg/dL (ref 65–99)
Potassium: 3.9 mmol/L (ref 3.5–5.2)
Sodium: 141 mmol/L (ref 134–144)
Total Protein: 6.7 g/dL (ref 6.0–8.5)

## 2019-07-11 LAB — SEDIMENTATION RATE: Sed Rate: 6 mm/hr (ref 0–40)

## 2019-07-11 LAB — VITAMIN B12: Vitamin B-12: 690 pg/mL (ref 232–1245)

## 2019-07-11 LAB — TSH: TSH: 1.78 u[IU]/mL (ref 0.450–4.500)

## 2019-07-11 LAB — ANA: Anti Nuclear Antibody (ANA): NEGATIVE

## 2019-07-11 MED ORDER — VITAMIN D3 1.25 MG (50000 UT) PO TABS: 1.0000 | ORAL_TABLET | ORAL | 1 refills | Status: DC

## 2019-07-12 LAB — LYME AB/WESTERN BLOT REFLEX
LYME DISEASE AB, QUANT, IGM: <0.8 index (ref 0.00–0.79)
Lyme IgG/IgM Ab: <0.91 {ISR} (ref 0.00–0.90)

## 2020-12-14 LAB — HM MAMMOGRAPHY

## 2020-12-18 ENCOUNTER — Encounter: Payer: Self-pay | Admitting: Family Medicine

## 2022-02-23 ENCOUNTER — Encounter: Payer: Self-pay | Admitting: Family Medicine

## 2022-03-10 ENCOUNTER — Encounter: Payer: Self-pay | Admitting: Family Medicine

## 2022-03-10 ENCOUNTER — Ambulatory Visit (INDEPENDENT_AMBULATORY_CARE_PROVIDER_SITE_OTHER): Payer: BC Managed Care – PPO | Admitting: Family Medicine

## 2022-03-10 ENCOUNTER — Other Ambulatory Visit: Payer: Self-pay

## 2022-03-10 VITALS — BP 122/72 | HR 76 | Temp 97.9°F | Resp 16 | Ht 62.0 in | Wt 117.4 lb

## 2022-03-10 DIAGNOSIS — E559 Vitamin D deficiency, unspecified: Secondary | ICD-10-CM

## 2022-03-10 DIAGNOSIS — Z13220 Encounter for screening for lipoid disorders: Secondary | ICD-10-CM | POA: Diagnosis not present

## 2022-03-10 DIAGNOSIS — Z131 Encounter for screening for diabetes mellitus: Secondary | ICD-10-CM

## 2022-03-10 DIAGNOSIS — Z0000 Encounter for general adult medical examination without abnormal findings: Secondary | ICD-10-CM

## 2022-03-10 DIAGNOSIS — I7300 Raynaud's syndrome without gangrene: Secondary | ICD-10-CM

## 2022-03-10 DIAGNOSIS — Z23 Encounter for immunization: Secondary | ICD-10-CM

## 2022-03-10 DIAGNOSIS — R87619 Unspecified abnormal cytological findings in specimens from cervix uteri: Secondary | ICD-10-CM

## 2022-03-10 MED ORDER — VITAMIN D3 25 MCG (1000 UT) PO CAPS: 1.0000 | ORAL_CAPSULE | Freq: Every day | ORAL | Status: AC

## 2022-03-10 MED ORDER — ASCORBIC ACID 500 MG PO TABS: 500.0000 mg | ORAL_TABLET | Freq: Every day | ORAL | Status: AC

## 2022-03-10 NOTE — Progress Notes (Unsigned)
Patient ID: Lori Parks, female    DOB: 08/13/1966, 56 y.o.   MRN: [id number]  This visit was conducted in person.  BP 122/72    Pulse 76    Temp 97.9 F (36.6 C)    Resp 16    Ht '5\' 2"'$  (1.575 m)    Wt 117 lb 6.4 oz (53.3 kg)    LMP 01/13/2011    SpO2 97%    BMI 21.47 kg/m    CC: discuss paperwork  Subjective:   HPI: Lori Parks is a 56 y.o. female presenting on 03/10/2022 for Form Completion   ER physician.  Last seen 06/2019.  Significant family medical issues. Mother passed away December 29, 2020. Brother and sisters had complications from [place].  Private practice - behavioral health in [place].   Requests letter listing medical history for international life insurance   Preventative: Colonoscopy WNL through [facility] in [place] (2021) Lung cancer screening - pap smear WNL 09/2021 Mammogram December 29, 2020 Birads1 @ [place]  Well woman exam - [facility]  Menopausal - LMP ~2016 DEXA scan - not due Flu shot - yearly through work, did not get this year  COVID shot - Moderna x2, no booster Tetanus shot - Tdap today  Shingrix - discussed, declines Seat belt use discussed  Sunscreen use discussed. No changing moles on skin. Sleep - averaging 5 hours/night  Non smoker  Alcohol - none Dentist - due  Eye exam - yearly  Caffeine: 4-5 cups coffee.  1 can diet soda/day  Lives with mother, 2 children, international student from [place], and cousin.  2 dogs.  ER physician  Activity - pilates, yoga, zumba, [employer] Diet - good water/gatorade, fruits/vegetables      Relevant past medical, surgical, family and social history reviewed and updated as indicated. Interim medical history since our last visit reviewed. Allergies and medications reviewed and updated. Outpatient Medications Prior to Visit  Medication Sig Dispense Refill   acetaminophen (TYLENOL) 325 MG tablet Take 650 mg by mouth every 6 (six) hours as needed.     Cholecalciferol (VITAMIN D3) 1.25 MG (50000 UT) TABS  Take 1 tablet by mouth once a week. 12 tablet 1   No facility-administered medications prior to visit.     Per HPI unless specifically indicated in ROS section below Review of Systems  Constitutional:  Negative for activity change, appetite change, chills, fatigue, fever and unexpected weight change.  HENT:  Negative for hearing loss.   Eyes:  Negative for visual disturbance.  Respiratory:  Negative for cough, chest tightness, shortness of breath and wheezing.   Cardiovascular:  Negative for chest pain, palpitations and leg swelling.  Gastrointestinal:  Negative for abdominal distention, abdominal pain, blood in stool, constipation, diarrhea, nausea and vomiting.  Genitourinary:  Negative for difficulty urinating and hematuria.  Musculoskeletal:  Negative for arthralgias, myalgias and neck pain.  Skin:  Negative for rash.  Neurological:  Negative for dizziness, seizures, syncope and headaches.  Hematological:  Negative for adenopathy. Does not bruise/bleed easily.  Psychiatric/Behavioral:  Negative for dysphoric mood. The patient is not nervous/anxious.    Objective:  BP 122/72    Pulse 76    Temp 97.9 F (36.6 C)    Resp 16    Ht '5\' 2"'$  (1.575 m)    Wt 117 lb 6.4 oz (53.3 kg)    LMP 01/13/2011    SpO2 97%    BMI 21.47 kg/m   Wt Readings from Last 3 Encounters:  03/10/22 117 lb  6.4 oz (53.3 kg)  07/08/19 125 lb (56.7 kg)  01/17/19 125 lb (56.7 kg)      Physical Exam Vitals and nursing note reviewed.  Constitutional:      Appearance: Normal appearance. She is not ill-appearing.  HENT:     Head: Normocephalic and atraumatic.     Right Ear: Tympanic membrane, ear canal and external ear normal. There is no impacted cerumen.     Left Ear: Tympanic membrane, ear canal and external ear normal. There is no impacted cerumen.  Eyes:     General:        Right eye: No discharge.        Left eye: No discharge.     Extraocular Movements: Extraocular movements intact.      Conjunctiva/sclera: Conjunctivae normal.     Pupils: Pupils are equal, round, and reactive to light.  Neck:     Thyroid: No thyroid mass or thyromegaly.  Cardiovascular:     Rate and Rhythm: Normal rate and regular rhythm.     Pulses: Normal pulses.     Heart sounds: Normal heart sounds. No murmur heard. Pulmonary:     Effort: Pulmonary effort is normal. No respiratory distress.     Breath sounds: Normal breath sounds. No wheezing, rhonchi or rales.  Abdominal:     General: Bowel sounds are normal. There is no distension.     Palpations: Abdomen is soft. There is no mass.     Tenderness: There is no abdominal tenderness. There is no guarding or rebound.     Hernia: No hernia is present.  Musculoskeletal:     Cervical back: Normal range of motion and neck supple. No rigidity.     Right lower leg: No edema.     Left lower leg: No edema.  Lymphadenopathy:     Cervical: No cervical adenopathy.  Skin:    General: Skin is warm and dry.     Findings: No rash.  Neurological:     General: No focal deficit present.     Mental Status: She is alert. Mental status is at baseline.  Psychiatric:        Mood and Affect: Mood normal.        Behavior: Behavior normal.      Results for orders placed or performed in visit on 12/18/20  HM MAMMOGRAPHY  Result Value Ref Range   HM Mammogram 0-4 Bi-Rad 0-4 Bi-Rad, Self Reported Normal    Assessment & Plan:  This visit occurred during the SARS-CoV-2 public health emergency.  Safety protocols were in place, including screening questions prior to the visit, additional usage of staff PPE, and extensive cleaning of exam room while observing appropriate contact time as indicated for disinfecting solutions.   Problem List Items Addressed This Visit     Routine general medical examination at a health care facility - Primary (Chronic)   Relevant Orders   TSH   CBC with Differential/Platelet   Vitamin D deficiency   Relevant Orders   VITAMIN D 25  Hydroxy (Vit-D Deficiency, Fractures)   Other Visit Diagnoses     Lipid screening       Relevant Orders   Lipid panel   Diabetes mellitus screening       Relevant Orders   Comprehensive metabolic panel        Meds ordered this encounter  Medications   Cholecalciferol (VITAMIN D3) 25 MCG (1000 UT) CAPS    Sig: Take 1 capsule (1,000 Units total) by mouth  daily.    Dispense:  30 capsule   ascorbic acid (VITAMIN C) 500 MG tablet    Sig: Take 1 tablet (500 mg total) by mouth daily.   Orders Placed This Encounter  Procedures   VITAMIN D 25 Hydroxy (Vit-D Deficiency, Fractures)   Lipid panel   Comprehensive metabolic panel   TSH   CBC with Differential/Platelet    Patient instructions: We will request latest colonoscopy from [facility] in [facility], [place].  Tdap today.  Consider shingrix vaccine.  Schedule dentist appointment  Labs today Good to see you today  Follow up plan: No follow-ups on file.  Ria Bush, MD

## 2022-03-10 NOTE — Patient Instructions (Addendum)
We will request latest colonoscopy from [facility] in [facility], [place].  ?Tdap today.  ?Consider shingrix vaccine.  ?Schedule dentist appointment  ?Labs today ?Good to see you today ? ?Health Maintenance for Postmenopausal Women ?Menopause is a normal process in which your ability to get pregnant comes to an end. This process happens slowly over many months or years, usually between the ages of 3 and 22. Menopause is complete when you have missed your menstrual period for 12 months. ?It is important to talk with your health care provider about some of the most common conditions that affect women after menopause (postmenopausal women). These include heart disease, cancer, and bone loss (osteoporosis). Adopting a healthy lifestyle and getting preventive care can help to promote your health and wellness. The actions you take can also lower your chances of developing some of these common conditions. ?What are the signs and symptoms of menopause? ?During menopause, you may have the following symptoms: ?Hot flashes. These can be moderate or severe. ?Night sweats. ?Decrease in sex drive. ?Mood swings. ?Headaches. ?Tiredness (fatigue). ?Irritability. ?Memory problems. ?Problems falling asleep or staying asleep. ?Talk with your health care provider about treatment options for your symptoms. ?Do I need hormone replacement therapy? ?Hormone replacement therapy is effective in treating symptoms that are caused by menopause, such as hot flashes and night sweats. ?Hormone replacement carries certain risks, especially as you become older. If you are thinking about using estrogen or estrogen with progestin, discuss the benefits and risks with your health care provider. ?How can I reduce my risk for heart disease and stroke? ?The risk of heart disease, heart attack, and stroke increases as you age. One of the causes may be a change in the body's hormones during menopause. This can affect how your body uses dietary fats,  triglycerides, and cholesterol. Heart attack and stroke are medical emergencies. There are many things that you can do to help prevent heart disease and stroke. ?Watch your blood pressure ?High blood pressure causes heart disease and increases the risk of stroke. This is more likely to develop in people who have high blood pressure readings or are overweight. ?Have your blood pressure checked: ?Every 3-5 years if you are 29-88 years of age. ?Every year if you are 19 years old or older. ?Eat a healthy diet ? ?Eat a diet that includes plenty of vegetables, fruits, low-fat dairy products, and lean protein. ?Do not eat a lot of foods that are high in solid fats, added sugars, or sodium. ?Get regular exercise ?Get regular exercise. This is one of the most important things you can do for your health. Most adults should: ?Try to exercise for at least 150 minutes each week. The exercise should increase your heart rate and make you sweat (moderate-intensity exercise). ?Try to do strengthening exercises at least twice each week. Do these in addition to the moderate-intensity exercise. ?Spend less time sitting. Even light physical activity can be beneficial. ?Other tips ?Work with your health care provider to achieve or maintain a healthy weight. ?Do not use any products that contain nicotine or tobacco. These products include cigarettes, chewing tobacco, and vaping devices, such as e-cigarettes. If you need help quitting, ask your health care provider. ?Know your numbers. Ask your health care provider to check your cholesterol and your blood sugar (glucose). Continue to have your blood tested as directed by your health care provider. ?Do I need screening for cancer? ?Depending on your health history and family history, you may need to  have cancer screenings at different stages of your life. This may include screening for: ?Breast cancer. ?Cervical cancer. ?Lung cancer. ?Colorectal cancer. ?What is my risk for  osteoporosis? ?After menopause, you may be at increased risk for osteoporosis. Osteoporosis is a condition in which bone destruction happens more quickly than new bone creation. To help prevent osteoporosis or the bone fractures that can happen because of osteoporosis, you may take the following actions: ?If you are 77-75 years old, get at least 1,000 mg of calcium and at least 600 international units (IU) of vitamin D per day. ?If you are older than age 20 but younger than age 31, get at least 1,200 mg of calcium and at least 600 international units (IU) of vitamin D per day. ?If you are older than age 72, get at least 1,200 mg of calcium and at least 800 international units (IU) of vitamin D per day. ?Smoking and drinking excessive alcohol increase the risk of osteoporosis. Eat foods that are rich in calcium and vitamin D, and do weight-bearing exercises several times each week as directed by your health care provider. ?How does menopause affect my mental health? ?Depression may occur at any age, but it is more common as you become older. Common symptoms of depression include: ?Feeling depressed. ?Changes in sleep patterns. ?Changes in appetite or eating patterns. ?Feeling an overall lack of motivation or enjoyment of activities that you previously enjoyed. ?Frequent crying spells. ?Talk with your health care provider if you think that you are experiencing any of these symptoms. ?General instructions ?See your health care provider for regular wellness exams and vaccines. This may include: ?Scheduling regular health, dental, and eye exams. ?Getting and maintaining your vaccines. These include: ?Influenza vaccine. Get this vaccine each year before the flu season begins. ?Pneumonia vaccine. ?Shingles vaccine. ?Tetanus, diphtheria, and pertussis (Tdap) booster vaccine. ?Your health care provider may also recommend other immunizations. ?Tell your health care provider if you have ever been abused or do not feel safe at  home. ?Summary ?Menopause is a normal process in which your ability to get pregnant comes to an end. ?This condition causes hot flashes, night sweats, decreased interest in sex, mood swings, headaches, or lack of sleep. ?Treatment for this condition may include hormone replacement therapy. ?Take actions to keep yourself healthy, including exercising regularly, eating a healthy diet, watching your weight, and checking your blood pressure and blood sugar levels. ?Get screened for cancer and depression. Make sure that you are up to date with all your vaccines. ?This information is not intended to replace advice given to you by your health care provider. Make sure you discuss any questions you have with your health care provider. ?Document Revised: 05/06/2021 Document Reviewed: 05/06/2021 ?Elsevier Patient Education ? [place]. ? ?

## 2022-03-11 ENCOUNTER — Encounter: Payer: Self-pay | Admitting: Family Medicine

## 2022-03-11 DIAGNOSIS — I7300 Raynaud's syndrome without gangrene: Secondary | ICD-10-CM | POA: Insufficient documentation

## 2022-03-11 LAB — LIPID PANEL
Cholesterol: 231 mg/dL — ABNORMAL HIGH (ref 0–200)
HDL: 84.5 mg/dL (ref >39.00)
LDL Cholesterol: 132 mg/dL — ABNORMAL HIGH (ref 0–99)
NonHDL: 146.94
Total CHOL/HDL Ratio: 3
Triglycerides: 76 mg/dL (ref 0.0–149.0)
VLDL: 15.2 mg/dL (ref 0.0–40.0)

## 2022-03-11 LAB — COMPREHENSIVE METABOLIC PANEL
ALT: 13 U/L (ref 0–35)
AST: 18 U/L (ref 0–37)
Albumin: 4.4 g/dL (ref 3.5–5.2)
Alkaline Phosphatase: 32 U/L — ABNORMAL LOW (ref 39–117)
BUN: 22 mg/dL (ref 6–23)
CO2: 30 mEq/L (ref 19–32)
Calcium: 9.4 mg/dL (ref 8.4–10.5)
Chloride: 105 mEq/L (ref 96–112)
Creatinine, Ser: 1.02 mg/dL (ref 0.40–1.20)
GFR: 61.54 mL/min (ref >60.00)
Glucose, Bld: 92 mg/dL (ref 70–99)
Potassium: 4.5 mEq/L (ref 3.5–5.1)
Sodium: 141 mEq/L (ref 135–145)
Total Bilirubin: 0.5 mg/dL (ref 0.2–1.2)
Total Protein: 6.9 g/dL (ref 6.0–8.3)

## 2022-03-11 LAB — CBC WITH DIFFERENTIAL/PLATELET
Basophils Absolute: 0 10*3/uL (ref 0.0–0.1)
Basophils Relative: 0.8 % (ref 0.0–3.0)
Eosinophils Absolute: 0 10*3/uL (ref 0.0–0.7)
Eosinophils Relative: 1.3 % (ref 0.0–5.0)
HCT: 40.2 % (ref 36.0–46.0)
Hemoglobin: 13.4 g/dL (ref 12.0–15.0)
Lymphocytes Relative: 48.9 % — ABNORMAL HIGH (ref 12.0–46.0)
Lymphs Abs: 1.7 10*3/uL (ref 0.7–4.0)
MCHC: 33.4 g/dL (ref 30.0–36.0)
MCV: 95.9 fl (ref 78.0–100.0)
Monocytes Absolute: 0.3 10*3/uL (ref 0.1–1.0)
Monocytes Relative: 7.5 % (ref 3.0–12.0)
Neutro Abs: 1.4 10*3/uL (ref 1.4–7.7)
Neutrophils Relative %: 41.5 % — ABNORMAL LOW (ref 43.0–77.0)
Platelets: 168 10*3/uL (ref 150.0–400.0)
RBC: 4.19 Mil/uL (ref 3.87–5.11)
RDW: 13.5 % (ref 11.5–15.5)
WBC: 3.5 10*3/uL — ABNORMAL LOW (ref 4.0–10.5)

## 2022-03-11 LAB — VITAMIN D 25 HYDROXY (VIT D DEFICIENCY, FRACTURES): VITD: 11.21 ng/mL — ABNORMAL LOW (ref 30.00–100.00)

## 2022-03-11 LAB — TSH: TSH: 1.25 u[IU]/mL (ref 0.35–5.50)

## 2022-03-11 NOTE — Assessment & Plan Note (Signed)
Preventative protocols reviewed and updated unless pt declined. Discussed healthy diet and lifestyle.  

## 2022-03-11 NOTE — Assessment & Plan Note (Addendum)
Continues seeing GYN in [place], last seen 2022 with normal pap smear.  ?

## 2022-03-11 NOTE — Assessment & Plan Note (Signed)
H/o this, stable period ?

## 2022-03-11 NOTE — Assessment & Plan Note (Signed)
Update levels - not regular with replacement.  ?

## 2022-03-14 ENCOUNTER — Encounter: Payer: Self-pay | Admitting: Family Medicine

## 2022-03-14 DIAGNOSIS — E785 Hyperlipidemia, unspecified: Secondary | ICD-10-CM

## 2022-03-14 DIAGNOSIS — R748 Abnormal levels of other serum enzymes: Secondary | ICD-10-CM

## 2022-03-14 DIAGNOSIS — E559 Vitamin D deficiency, unspecified: Secondary | ICD-10-CM

## 2022-03-14 DIAGNOSIS — D72819 Decreased white blood cell count, unspecified: Secondary | ICD-10-CM

## 2022-03-18 ENCOUNTER — Encounter: Payer: Self-pay | Admitting: Family Medicine

## 2022-03-19 ENCOUNTER — Encounter: Payer: Self-pay | Admitting: Family Medicine

## 2022-04-22 NOTE — Addendum Note (Signed)
Addended by: Ria Bush on: 04/22/2022 07:33 AM ? ? Modules accepted: Orders ? ?

## 2022-04-25 ENCOUNTER — Other Ambulatory Visit (INDEPENDENT_AMBULATORY_CARE_PROVIDER_SITE_OTHER): Payer: BC Managed Care – PPO

## 2022-04-25 DIAGNOSIS — E559 Vitamin D deficiency, unspecified: Secondary | ICD-10-CM

## 2022-04-25 DIAGNOSIS — R748 Abnormal levels of other serum enzymes: Secondary | ICD-10-CM

## 2022-04-25 DIAGNOSIS — D72819 Decreased white blood cell count, unspecified: Secondary | ICD-10-CM

## 2022-04-25 DIAGNOSIS — E785 Hyperlipidemia, unspecified: Secondary | ICD-10-CM | POA: Diagnosis not present

## 2022-04-25 LAB — LIPID PANEL
Cholesterol: 198 mg/dL (ref 0–200)
HDL: 76.7 mg/dL (ref >39.00)
LDL Cholesterol: 109 mg/dL — ABNORMAL HIGH (ref 0–99)
NonHDL: 121.17
Total CHOL/HDL Ratio: 3
Triglycerides: 62 mg/dL (ref 0.0–149.0)
VLDL: 12.4 mg/dL (ref 0.0–40.0)

## 2022-04-25 LAB — CBC WITH DIFFERENTIAL/PLATELET
Basophils Absolute: 0 10*3/uL (ref 0.0–0.1)
Basophils Relative: 1 % (ref 0.0–3.0)
Eosinophils Absolute: 0.1 10*3/uL (ref 0.0–0.7)
Eosinophils Relative: 2 % (ref 0.0–5.0)
HCT: 38.5 % (ref 36.0–46.0)
Hemoglobin: 12.9 g/dL (ref 12.0–15.0)
Lymphocytes Relative: 50.3 % — ABNORMAL HIGH (ref 12.0–46.0)
Lymphs Abs: 1.6 10*3/uL (ref 0.7–4.0)
MCHC: 33.5 g/dL (ref 30.0–36.0)
MCV: 95 fl (ref 78.0–100.0)
Monocytes Absolute: 0.3 10*3/uL (ref 0.1–1.0)
Monocytes Relative: 10 % (ref 3.0–12.0)
Neutro Abs: 1.1 10*3/uL — ABNORMAL LOW (ref 1.4–7.7)
Neutrophils Relative %: 36.7 % — ABNORMAL LOW (ref 43.0–77.0)
Platelets: 150 10*3/uL (ref 150.0–400.0)
RBC: 4.05 Mil/uL (ref 3.87–5.11)
RDW: 13.2 % (ref 11.5–15.5)
WBC: 3.1 10*3/uL — ABNORMAL LOW (ref 4.0–10.5)

## 2022-04-25 LAB — VITAMIN D 25 HYDROXY (VIT D DEFICIENCY, FRACTURES): VITD: 29.37 ng/mL — ABNORMAL LOW (ref 30.00–100.00)

## 2022-04-25 LAB — HM MAMMOGRAPHY

## 2022-04-26 ENCOUNTER — Encounter: Payer: Self-pay | Admitting: Family Medicine

## 2022-04-29 LAB — ZINC: Zinc: 66 ug/dL (ref 60–130)

## 2022-04-29 NOTE — Telephone Encounter (Signed)
Pt called in regarding the status of the letter . Would like a call back [phone]  ?

## 2022-05-01 NOTE — Telephone Encounter (Signed)
Lori Parks picked up the note ?

## 2022-05-01 NOTE — Telephone Encounter (Signed)
Spoke with pt notifying her the letter picked up does not have Dr. Synthia Innocent sig on it.  Asked if she wanted the lttr with sig.  Pt says yes and asked that it be mailed to her. ? ?Mailed letter to pt.  ?

## 2022-05-02 ENCOUNTER — Encounter: Payer: Self-pay | Admitting: Family Medicine

## 2023-01-19 ENCOUNTER — Encounter: Payer: Self-pay | Admitting: Family Medicine

## 2023-01-19 DIAGNOSIS — I7300 Raynaud's syndrome without gangrene: Secondary | ICD-10-CM

## 2023-01-19 DIAGNOSIS — M25541 Pain in joints of right hand: Secondary | ICD-10-CM

## 2023-01-22 ENCOUNTER — Telehealth: Payer: Self-pay | Admitting: Family Medicine

## 2023-01-22 ENCOUNTER — Other Ambulatory Visit (INDEPENDENT_AMBULATORY_CARE_PROVIDER_SITE_OTHER): Payer: 59

## 2023-01-22 DIAGNOSIS — M25542 Pain in joints of left hand: Secondary | ICD-10-CM

## 2023-01-22 DIAGNOSIS — I7300 Raynaud's syndrome without gangrene: Secondary | ICD-10-CM

## 2023-01-22 DIAGNOSIS — M25541 Pain in joints of right hand: Secondary | ICD-10-CM | POA: Diagnosis not present

## 2023-01-22 NOTE — Telephone Encounter (Signed)
Patient is coming in to have labs done today. She was wondering if the results could be sent over STAT. Thank you!

## 2023-01-22 NOTE — Telephone Encounter (Signed)
Spoke with pt to get clarification of request. States she was already informed labs, or at least some of them, can't be ordered STAT. Pt expresses her thanks for the call back.

## 2023-01-22 NOTE — Telephone Encounter (Signed)
Looks like pt schedule lab visit today at 2:30.

## 2023-01-23 LAB — SEDIMENTATION RATE: Sed Rate: 9 mm/hr (ref 0–30)

## 2023-01-23 LAB — C-REACTIVE PROTEIN: CRP: <1 mg/dL (ref 0.5–20.0)

## 2023-01-24 LAB — RHEUMATOID FACTOR: Rheumatoid fact SerPl-aCnc: <14 IU/mL (ref <14)

## 2023-01-24 LAB — C3 COMPLEMENT: C3 Complement: 104 mg/dL (ref 83–193)

## 2023-01-24 LAB — ANTI-DNA ANTIBODY, DOUBLE-STRANDED: ds DNA Ab: 2 IU/mL

## 2023-01-24 LAB — ANA: Anti Nuclear Antibody (ANA): NEGATIVE

## 2023-01-24 LAB — VDRL: VDRL, Serum: NONREACTIVE

## 2023-01-30 NOTE — Telephone Encounter (Signed)
Patient was advised by telephone that her lab work is up front ready for pickup.

## 2023-01-30 NOTE — Telephone Encounter (Signed)
Printed copy of lab results and placed at front office.

## 2023-01-30 NOTE — Telephone Encounter (Signed)
Patient called and wanted to pick up her lab results. Call back number [phone].

## 2023-01-30 NOTE — Telephone Encounter (Signed)
Labs printed out and put in folder up front. Tired to call pt but vm was full unable to leave a message

## 2024-02-19 ENCOUNTER — Ambulatory Visit: Payer: Self-pay | Admitting: Urology

## 2024-02-26 ENCOUNTER — Ambulatory Visit: Payer: Self-pay | Admitting: Urology

## 2024-04-01 ENCOUNTER — Encounter: Payer: Self-pay | Admitting: Urology

## 2024-04-01 ENCOUNTER — Ambulatory Visit: Payer: 59 | Admitting: Urology

## 2024-04-01 VITALS — BP 117/72 | HR 92

## 2024-04-01 DIAGNOSIS — G5622 Lesion of ulnar nerve, left upper limb: Secondary | ICD-10-CM | POA: Diagnosis not present

## 2024-04-01 DIAGNOSIS — Z87442 Personal history of urinary calculi: Secondary | ICD-10-CM

## 2024-04-01 DIAGNOSIS — S8002XA Contusion of left knee, initial encounter: Secondary | ICD-10-CM | POA: Diagnosis not present

## 2024-04-01 NOTE — Progress Notes (Signed)
 I, Maysun Anabel Bene, acting as a scribe for Riki Altes, MD., have documented all relevant documentation on the behalf of Riki Altes, MD, as directed by Riki Altes, MD while in the presence of Riki Altes, MD.  04/01/2024 3:10 PM   Lori Parks [birth year] [id number]   Chief Complaint  Patient presents with   Establish Care    HPI: Lori Parks is a 58 y.o. female self-referred for her history, stone disease,   Prior ED visit in 2020 for left flank pain. CT performed at that visit showed a small stone in the bladder, and a punctate left lower pole renal calculus. Approximately 2 months ago, she had significant flank pain and passed a stone with a resolution of her symptoms.  She has 2 daughters with recurrent stone disease.  She does have urinary frequency and urgency, though drinks a significant amount of Powerade and Gatorade.  She drinks 20 ounces of coffee per day.   PMH: Past Medical History:  Diagnosis Date   Complete proximal hamstring tendon rupture 03/28/2012   s/p surgical repair   Epigastric hernia 01/29/2015   s/p repair by Dr Pleas Patricia at [facility]   Fibroids    mult small myomata (Dr. 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 Arlyce Dice)    Home Medications:  Allergies as of 04/01/2024       Reactions   Bactrim [sulfamethoxazole-trimethoprim] Swelling   Codeine Nausea And Vomiting, Rash        Medication List        Accurate as of April 01, 2024  3:10 PM. If you have any questions, ask your nurse or doctor.          acetaminophen 325 MG tablet Commonly known as: TYLENOL Take 650 mg by mouth every 6 (six) hours as needed.   ascorbic acid 500 MG tablet Commonly known as: VITAMIN C Take 1 tablet (500 mg total) by mouth daily.   NAC PO Take by mouth.   QC TUMERIC COMPLEX PO Take by mouth.   Vitamin D3 25 MCG (1000 UT) Caps Take 1 capsule (1,000 Units total) by mouth daily.        Allergies:  Allergies  Allergen Reactions   Bactrim [Sulfamethoxazole-Trimethoprim] Swelling   Codeine Nausea And Vomiting and Rash    Family History: Family History  Problem Relation Age of Onset   Arthritis Mother    Hypertension Mother    Stroke Maternal Grandmother    Hypertension Maternal Grandmother    Coronary artery disease Neg Hx  Cancer Neg Hx     Social History:  reports that she has never smoked. She has never used smokeless tobacco. She reports that she does not drink alcohol and does not use drugs.   Physical Exam: BP 117/72   Pulse 92   LMP 01/13/2011   Constitutional:  Alert and oriented, No acute distress. HEENT: [place] AT Respiratory: Normal respiratory effort, no increased work of breathing. Psychiatric: Normal mood and affect.   Pertinent Imaging: CT was personally reviewed and interpreted.   CT Renal Stone Study  Narrative CLINICAL DATA:  Left-sided flank and groin pain since this morning. Vomiting. History of hernia repair.  EXAM: CT ABDOMEN AND PELVIS WITHOUT CONTRAST  TECHNIQUE: Multidetector CT imaging of the abdomen and pelvis was performed following the standard  protocol without IV contrast.  COMPARISON:  None.  FINDINGS: The lack of intravenous contrast limits the ability to evaluate solid abdominal organs.  Lower chest: Limited visualization of the lower thorax demonstrates minimal dependent subpleural ground-glass atelectasis. No discrete focal airspace opacities. No pleural effusion.  Normal heart size.  No pericardial effusion.  Hepatobiliary: Normal hepatic contour. Normal noncontrast appearance of the gallbladder given degree distention. No radiopaque gallstones. No ascites.  Pancreas: Normal noncontrast appearance of the pancreas.  Spleen: Normal noncontrast appearance of the spleen. Note is made of a small splenule.  Adrenals/Urinary Tract: There is a punctate (approximately 0 point 2 cm) stone lying dependently within the urinary bladder. This finding is associated with mild upstream left-sided ureterectasis and pelvicaliectasis and likely represents a recently passed left-sided ureteral stone. There is an additional punctate (approximately 1 mm) nonobstructing stone within the inferior pole of the left kidney (coronal image 43, series 5). Punctate bilateral gonadal phleboliths are seen bilaterally. No definite right-sided renal stones. No urinary obstruction.  There is a minimal amount of asymmetric left-sided perinephric stranding.  Normal noncontrast appearance the bilateral adrenal glands.  Otherwise normal appearance of the urinary bladder given degree of distention.  Stomach/Bowel: Moderate to large colonic stool burden without evidence of enteric obstruction. Normal noncontrast appearance of the terminal ileum and the retrocecal appendix. No pneumoperitoneum, pneumatosis or portal venous gas.  Vascular/Lymphatic: Normal caliber of the abdominal aorta.  No bulky retroperitoneal, pelvic or inguinal lymphadenopathy on this noncontrast examination.  Reproductive: Normal noncontrast appearance of the pelvic  organs. No discrete adnexal lesion on this noncontrast examination. Punctate phleboliths are seen with the lower pelvis bilaterally.  Other: Tiny mesenteric fat containing periumbilical hernia.  Musculoskeletal: No acute or aggressive osseous abnormalities ossicle adjacent to the right greater trochanter likely represents sequela of remote avulsive injury.  IMPRESSION: 1. Mild left-sided ureterectasis, pelvicaliectasis and asymmetric left-sided perinephric stranding with punctate (approximately 0.2 cm) stone within the urinary bladder favored to represent a recently passed left-sided ureteral stone. 2. Additional punctate (approximately 1 mm) nonobstructing left-sided renal stone. 3. No evidence of right-sided nephrolithiasis or urinary obstruction.   Electronically Signed By: Simonne Come M.D. On: 01/17/2019 11:09   Assessment & Plan:    1. Personal history of urinary calculi Presently asymptomatic.  We discussed metabolic evaluation, if desired versus general stone prevention guidelines.  KUB to assess for any obvious calculi, and she will be notified with the results.  I have reviewed the above documentation for accuracy and completeness, and I agree with the above.   Riki Altes, MD  [facility] [address], [address] [place], [place] [address] [phone]

## 2024-05-06 DIAGNOSIS — Z01411 Encounter for gynecological examination (general) (routine) with abnormal findings: Secondary | ICD-10-CM | POA: Diagnosis not present

## 2024-05-06 DIAGNOSIS — N393 Stress incontinence (female) (male): Secondary | ICD-10-CM | POA: Diagnosis not present

## 2024-05-06 DIAGNOSIS — Z01419 Encounter for gynecological examination (general) (routine) without abnormal findings: Secondary | ICD-10-CM | POA: Diagnosis not present

## 2024-05-06 DIAGNOSIS — N882 Stricture and stenosis of cervix uteri: Secondary | ICD-10-CM | POA: Diagnosis not present

## 2024-05-06 DIAGNOSIS — Z1331 Encounter for screening for depression: Secondary | ICD-10-CM | POA: Diagnosis not present

## 2024-05-06 DIAGNOSIS — B009 Herpesviral infection, unspecified: Secondary | ICD-10-CM | POA: Diagnosis not present

## 2024-05-06 DIAGNOSIS — Z1231 Encounter for screening mammogram for malignant neoplasm of breast: Secondary | ICD-10-CM | POA: Diagnosis not present

## 2024-05-06 DIAGNOSIS — Z1151 Encounter for screening for human papillomavirus (HPV): Secondary | ICD-10-CM | POA: Diagnosis not present

## 2024-05-06 DIAGNOSIS — Z6823 Body mass index (BMI) 23.0-23.9, adult: Secondary | ICD-10-CM | POA: Diagnosis not present

## 2024-07-25 DIAGNOSIS — M545 Low back pain, unspecified: Secondary | ICD-10-CM | POA: Diagnosis not present

## 2024-07-25 DIAGNOSIS — M542 Cervicalgia: Secondary | ICD-10-CM | POA: Diagnosis not present
